# Patient Record
Sex: Female | Born: 1955
Health system: Southern US, Community
[De-identification: ages and names within clinical notes are randomized; demographics above are authoritative.]

## PROBLEM LIST (undated history)

## (undated) DIAGNOSIS — I10 Essential (primary) hypertension: Secondary | ICD-10-CM

## (undated) DIAGNOSIS — I340 Nonrheumatic mitral (valve) insufficiency: Secondary | ICD-10-CM

## (undated) DIAGNOSIS — E559 Vitamin D deficiency, unspecified: Secondary | ICD-10-CM

## (undated) DIAGNOSIS — I509 Heart failure, unspecified: Secondary | ICD-10-CM

## (undated) DIAGNOSIS — E039 Hypothyroidism, unspecified: Secondary | ICD-10-CM

## (undated) DIAGNOSIS — I5020 Unspecified systolic (congestive) heart failure: Secondary | ICD-10-CM

## (undated) DIAGNOSIS — K219 Gastro-esophageal reflux disease without esophagitis: Secondary | ICD-10-CM

## (undated) DIAGNOSIS — T7840XA Allergy, unspecified, initial encounter: Secondary | ICD-10-CM

## (undated) DIAGNOSIS — Z720 Tobacco use: Secondary | ICD-10-CM

## (undated) DIAGNOSIS — F419 Anxiety disorder, unspecified: Secondary | ICD-10-CM

## (undated) HISTORY — DX: Gastro-esophageal reflux disease without esophagitis: K21.9

## (undated) HISTORY — DX: Anxiety disorder, unspecified: F41.9

## (undated) HISTORY — PX: DIAGNOSTIC LAPAROSCOPY: SUR761

## (undated) HISTORY — PX: ABDOMINAL HYSTERECTOMY: SHX81

## (undated) HISTORY — DX: Allergy, unspecified, initial encounter: T78.40XA

## (undated) HISTORY — PX: TUBAL LIGATION: SHX77

## (undated) HISTORY — DX: Vitamin D deficiency, unspecified: E55.9

---
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
## 2000-03-04 ENCOUNTER — Emergency Department (HOSPITAL_COMMUNITY): Admission: EM | Admit: 2000-03-04 | Discharge: 2000-03-04 | Payer: Self-pay

## 2001-09-20 ENCOUNTER — Emergency Department (HOSPITAL_COMMUNITY): Admission: EM | Admit: 2001-09-20 | Discharge: 2001-09-20 | Payer: Self-pay | Admitting: Emergency Medicine

## 2002-01-10 ENCOUNTER — Emergency Department (HOSPITAL_COMMUNITY): Admission: EM | Admit: 2002-01-10 | Discharge: 2002-01-10 | Payer: Self-pay

## 2002-09-10 ENCOUNTER — Emergency Department (HOSPITAL_COMMUNITY): Admission: EM | Admit: 2002-09-10 | Discharge: 2002-09-10 | Payer: Self-pay | Admitting: Emergency Medicine

## 2002-10-13 ENCOUNTER — Encounter: Admission: RE | Admit: 2002-10-13 | Discharge: 2002-10-13 | Payer: Self-pay | Admitting: Family Medicine

## 2003-05-12 ENCOUNTER — Encounter: Admission: RE | Admit: 2003-05-12 | Discharge: 2003-05-12 | Payer: Self-pay | Admitting: Family Medicine

## 2005-02-15 ENCOUNTER — Emergency Department (HOSPITAL_COMMUNITY): Admission: EM | Admit: 2005-02-15 | Discharge: 2005-02-15 | Payer: Self-pay | Admitting: *Deleted

## 2006-08-27 DIAGNOSIS — F17200 Nicotine dependence, unspecified, uncomplicated: Secondary | ICD-10-CM | POA: Insufficient documentation

## 2006-08-27 DIAGNOSIS — D259 Leiomyoma of uterus, unspecified: Secondary | ICD-10-CM | POA: Insufficient documentation

## 2006-11-19 ENCOUNTER — Ambulatory Visit: Payer: Self-pay | Admitting: Gynecology

## 2006-11-19 ENCOUNTER — Encounter (INDEPENDENT_AMBULATORY_CARE_PROVIDER_SITE_OTHER): Payer: Self-pay | Admitting: Gynecology

## 2007-01-11 ENCOUNTER — Ambulatory Visit: Payer: Self-pay | Admitting: Gynecology

## 2007-01-11 ENCOUNTER — Encounter (INDEPENDENT_AMBULATORY_CARE_PROVIDER_SITE_OTHER): Payer: Self-pay | Admitting: Gynecology

## 2007-01-11 ENCOUNTER — Inpatient Hospital Stay (HOSPITAL_COMMUNITY): Admission: RE | Admit: 2007-01-11 | Discharge: 2007-01-14 | Payer: Self-pay | Admitting: Gynecology

## 2007-01-18 ENCOUNTER — Ambulatory Visit: Payer: Self-pay | Admitting: Obstetrics & Gynecology

## 2007-03-03 ENCOUNTER — Ambulatory Visit: Payer: Self-pay | Admitting: Obstetrics & Gynecology

## 2008-10-02 ENCOUNTER — Emergency Department (HOSPITAL_COMMUNITY): Admission: EM | Admit: 2008-10-02 | Discharge: 2008-10-02 | Payer: Self-pay | Admitting: Emergency Medicine

## 2009-05-09 ENCOUNTER — Encounter: Admission: RE | Admit: 2009-05-09 | Discharge: 2009-05-09 | Payer: Self-pay | Admitting: Occupational Medicine

## 2009-06-05 ENCOUNTER — Emergency Department (HOSPITAL_COMMUNITY): Admission: EM | Admit: 2009-06-05 | Discharge: 2009-06-05 | Payer: Self-pay | Admitting: Emergency Medicine

## 2009-06-15 ENCOUNTER — Encounter: Admission: RE | Admit: 2009-06-15 | Discharge: 2009-07-03 | Payer: Self-pay | Admitting: Occupational Medicine

## 2009-07-03 ENCOUNTER — Encounter: Admission: RE | Admit: 2009-07-03 | Discharge: 2009-10-01 | Payer: Self-pay | Admitting: Occupational Medicine

## 2009-08-12 ENCOUNTER — Emergency Department (HOSPITAL_COMMUNITY): Admission: EM | Admit: 2009-08-12 | Discharge: 2009-08-12 | Payer: Self-pay | Admitting: Emergency Medicine

## 2009-10-15 ENCOUNTER — Emergency Department (HOSPITAL_COMMUNITY): Admission: EM | Admit: 2009-10-15 | Discharge: 2009-10-15 | Payer: Self-pay | Admitting: Emergency Medicine

## 2010-10-09 LAB — BASIC METABOLIC PANEL
BUN: 8 mg/dL (ref 6–23)
CO2: 28 mEq/L (ref 19–32)
Calcium: 9.1 mg/dL (ref 8.4–10.5)
Chloride: 107 mEq/L (ref 96–112)
Creatinine, Ser: 0.47 mg/dL (ref 0.4–1.2)
GFR calc Af Amer: >60 mL/min (ref >60)
GFR calc non Af Amer: >60 mL/min (ref >60)
Glucose, Bld: 94 mg/dL (ref 70–99)
Potassium: 3.7 mEq/L (ref 3.5–5.1)
Sodium: 140 mEq/L (ref 135–145)

## 2010-10-09 LAB — URINALYSIS, ROUTINE W REFLEX MICROSCOPIC
Glucose, UA: NEGATIVE mg/dL
Ketones, ur: NEGATIVE mg/dL
Nitrite: NEGATIVE
Specific Gravity, Urine: 1.019 (ref 1.005–1.030)
pH: 6.5 (ref 5.0–8.0)

## 2010-11-12 NOTE — Group Therapy Note (Signed)
NAME:  Julia [name], Julia [name] NO.:  [id number]   MEDICAL RECORD NO.:  [id number]          PATIENT TYPE:  WOC   LOCATION:  [facility]                   FACILITY:  [facility]   PHYSICIAN:  Elsie Lincoln, MD      DATE OF BIRTH:  Dec 15, 1955   DATE OF SERVICE:  03/03/2007                                  CLINIC NOTE   HISTORY:  The patient is a 55 year old female 6 weeks after a TAH RSO.  She had her staples out several weeks ago, however, there is one staple  left and we took that out today after injecting 1 mL of 1% lidocaine and  the staple was removed easily, there was no disruption of the wound.  The patient is complaining of vaginal discharge.  There is a copious  yellowish discharge in the vagina which revealed clue cells on my exam.  There was no Trichomonas.  She does have a history of Trichomonas in the  past.  She has not been sexually active.  The patient was instructed not  to douche to help her vaginal pH to get back in balance.  The vaginal  cuff is intact and the bimanual exam is negative.  The patient is still  in need of a mammogram but due to her lack of insurance she is waiting  for the mammogram scholarship to come in October.  She is also in need  of a colonoscopy, I believe we did not address this today and will  address it at her next visit.  She can afford a colonoscopy and we will  send her home with a guaiac card.  The patient is to return in 1 year.           ______________________________  Elsie Lincoln, MD     KL/MEDQ  D:  03/03/2007  T:  03/03/2007  Job:  [id number]

## 2010-11-12 NOTE — Op Note (Signed)
NAME:  Julia [name], Julia [name]               ACCOUNT NO.:  [id number]   MEDICAL RECORD NO.:  [id number]          PATIENT TYPE:  INP   LOCATION:  9310                          [facility]   PHYSICIAN:  Ginger Carne, MD  DATE OF BIRTH:  [birth year]   DATE OF PROCEDURE:  01/11/2007  DATE OF DISCHARGE:                               OPERATIVE REPORT   PREOPERATIVE DIAGNOSIS:  Post hysterectomy bleeding.   POSTOPERATIVE DIAGNOSIS:  Post hysterectomy bleeding, vaginal cuff  bleeding.   OPERATIVE PROCEDURE:  Hemostatic control of vaginal cuff bleeding.   SURGEON:  Ginger Carne, M.D.   ASSISTANT:  None.   COMPLICATIONS:  None immediate.   ESTIMATED BLOOD LOSS:  200 mL.   ANESTHESIA:  General.   OPERATIVE FINDINGS:  Upon opening the abdomen 200 mL of freshly clotted  blood was noted in the cul-de-sac and around the vaginal cuff after  clearing said blood was noted.  There were two bleeding points on the  anterior cuff on the right half of the anterior cuff.  In addition the  angle demonstrated bleeding as well.  The left utero-ovarian ligament  and right infundibulopelvic ligaments were dry.  The uterine vasculature  was dry and both ureters were carefully identified throughout their  pelvic course of particularly near the cuff to assure no injury after  placement of hemostatic sutures.   OPERATIVE PROCEDURE:  The patient prepped and draped in usual fashion  and placed in lithotomy position.  Betadine solution used for antiseptic  and the patient already had a Foley catheter inserted from her previous  surgery.  Skin staples were removed.  Fascia suture was removed and the  abdomen opened.  After removing said blood clots.  The old suture line  was taken down off the cuff including the angles of the anterior  posterior cuff was then reapproximated with interrupted 0 Vicryl sutures  in a hemostatic fashion.  This was a one layer closure. At the end no  active bleeding noted.  The  patient was then irrigated lactated Ringer's  irrigant removed.  Inspection for 5 minutes revealed no active bleeding.  Clots removed from the abdomen.  The closure of the cuff of the fascia  in one  layer with zero PDS double loop running suture and skin staples for the  skin.  Instrument and sponge count were correct.  The patient tolerated  the procedure well, returned to the post anesthesia recovery room in  excellent condition.      Ginger Carne, MD  Electronically Signed     SHB/MEDQ  D:  01/11/2007  T:  01/11/2007  Job:  [id number]

## 2010-11-12 NOTE — Discharge Summary (Signed)
NAME:  Julia [name], Julia [name]               ACCOUNT NO.:  [id number]   MEDICAL RECORD NO.:  [id number]          PATIENT TYPE:  INP   LOCATION:  9310                          [facility]   PHYSICIAN:  Ginger Carne, MD  DATE OF BIRTH:  [birth year]   DATE OF ADMISSION:  01/11/2007  DATE OF DISCHARGE:                               DISCHARGE SUMMARY   REASON FOR HOSPITALIZATION:  24 to 26-week leiomyomatous uterus.   IN-HOSPITAL PROCEDURES:  Total abdominal hysterectomy, right salpingo-  oophorectomy with preservation of left tube and ovary, followed by  exploratory laparotomy and hemostatic control of cuff bleeding.   FINAL DIAGNOSIS:  24-26-week leiomyomatous uterus with postoperative  bleeding.   HOSPITAL COURSE:  This patient is a 55 year old [place] American female  who underwent the aforementioned procedures on January 11, 2007.  Within 2  hours of her hysterectomy it was noted that the patient had passed blood  clots in the vagina with fullness in cuff.  The patient was immediately  taken back to the operating theater at which time it was noted that  three anterior vaginal wall bleeders were encountered.  The patient was  perfectly dry prior to her closure at the time of the initial  hysterectomy.  Resuturing of the cuff followed exploratory laparotomy  and the patient was dry at the end of the procedure. Her preoperative  hemoglobin was 9.9, hematocrit 32.6. Immediately postoperatively her  hemoglobin had dropped to 8.6 and by July 15, following her exploratory  laparotomy had stabilized 8.1 with hematocrit of 26.5.  The patient was  asymptomatic for said blood loss.  Her abdomen was soft.  Incision dry.  Scant vaginal flow.  Calves without tenderness.  Lungs were clear.  The  patient was passing flatus at the time of her discharge.  She was  discharged with routine postoperative instructions including contacting  the office for temperature elevation above 100.4 degrees Fahrenheit,  increasing incisional pain, drainage, erythema, vaginal bleeding, GI or  GU complaints.  The patient was prescribed Percocet 5/325 one to two  every 4-6 hours and given a Dulcolax suppository and a tablet in the  morning of her discharge.  She will return on July 21 for staple removal  and then for her routine 4-6-week postoperative visit.  All questions  answered to the satisfaction of said patient and the patient verbalized  understanding of same.  The patient had no preoperative medications on a  routine basis that she was using.      Ginger Carne, MD  Electronically Signed     SHB/MEDQ  D:  01/14/2007  T:  01/14/2007  Job:  [id number]

## 2010-11-12 NOTE — Op Note (Signed)
NAME:  Julia [name], Julia [name]               ACCOUNT NO.:  [id number]   MEDICAL RECORD NO.:  [id number]          PATIENT TYPE:  INP   LOCATION:  9310                          [facility]   PHYSICIAN:  Ginger Carne, MD  DATE OF BIRTH:  06-17-1956   DATE OF PROCEDURE:  01/11/2007  DATE OF DISCHARGE:                               OPERATIVE REPORT   PREOPERATIVE DIAGNOSIS:  Greater than 20-week leiomyomatous uterus.   POSTOPERATIVE DIAGNOSIS:  26 to 28-week leiomyomatous uterus.   OPERATIVE PROCEDURE:  Total abdominal hysterectomy, right salpingo-  oophorectomy with preservation of left tube and ovary.   SURGEON:  Ginger Carne, M.D.   ASSISTANT:  Dr. Okey Dupre.   ESTIMATED BLOOD LOSS:  200 mL.   COMPLICATIONS:  None immediate.   SPECIMEN:  Uterus, cervix and right tube and ovary.   OPERATIVE FINDINGS:  Uterus was 26-28 weeks in size, soft and globular,  consistent with adenomyosis and leiomyomatous uterus.  The right ovary  contained a 3-4 cm simple cyst.  However, it was deemed appropriate to  remove said mass. The left ovary and tube appeared normal.  Therefore  the right tube and ovary were removed.  The patient had desired  preservation of one or both ovaries if possible as long as they appeared  normal.  Large and small bowel grossly normal.  Both ureters identified  throughout the pelvic course during the procedure.   OPERATIVE PROCEDURE:  The patient prepped and draped in usual fashion  and placed in the supine position.  Betadine solution used for  antiseptic.  The patient catheterized prior to procedure.  After  adequate general anesthesia a vertical supraumbilical and lower  abdominal incision was made and the abdomen opened.  Self-retaining  retractors utilized.  The round ligaments were clamped, cut and ligated  on either side with 0 Vicryl suture.  Anterior posterior peritoneal  reflection was dissected off the uterus in a standard Richardson  fashion.  The uterine  vasculature was clamped, cut and ligated with 0  Vicryl suture.  The left utero-ovarian ligament and right infundibular  pelvic ligaments were taken with double ligation 0 Vicryl suture.  Cervix, uterus removed from the vaginal cuff.  Copious irrigation  followed, irrigant removed and then closure of the cuff with 0 Vicryl  running interlocking suture, angles  tied separately.  Bleeding points hemostatically checked.  Blood clots  removed.  Closure of the fascial layers of with 0 PDS double loop suture  and skin staples for the skin. Instrument and sponge count were correct.  The patient tolerated the procedure well, returned to post anesthesia  recovery room in excellent condition.      Ginger Carne, MD  Electronically Signed     SHB/MEDQ  D:  01/11/2007  T:  01/11/2007  Job:  [id number]

## 2011-02-11 ENCOUNTER — Emergency Department (HOSPITAL_COMMUNITY)
Admission: EM | Admit: 2011-02-11 | Discharge: 2011-02-11 | Disposition: A | Discharge status: Home / Self Care | Payer: Managed Care, Other (non HMO) | Attending: Emergency Medicine | Admitting: Emergency Medicine

## 2011-02-11 DIAGNOSIS — I10 Essential (primary) hypertension: Secondary | ICD-10-CM | POA: Insufficient documentation

## 2011-02-11 DIAGNOSIS — M25519 Pain in unspecified shoulder: Secondary | ICD-10-CM | POA: Insufficient documentation

## 2011-02-11 DIAGNOSIS — S335XXA Sprain of ligaments of lumbar spine, initial encounter: Secondary | ICD-10-CM | POA: Insufficient documentation

## 2011-02-11 DIAGNOSIS — M545 Low back pain, unspecified: Secondary | ICD-10-CM | POA: Insufficient documentation

## 2011-04-14 LAB — BASIC METABOLIC PANEL WITH GFR
BUN: 2 — ABNORMAL LOW
CO2: 27
Calcium: 8.6
Chloride: 98
Creatinine, Ser: 0.57
GFR calc non Af Amer: >60
Glucose, Bld: 131 — ABNORMAL HIGH
Potassium: 4
Sodium: 132 — ABNORMAL LOW

## 2011-04-14 LAB — CBC
HCT: 26.5 — ABNORMAL LOW
Hemoglobin: 8.1 — ABNORMAL LOW
MCHC: 30.7
RBC: 3.64 — ABNORMAL LOW
RDW: 21.5 — ABNORMAL HIGH

## 2011-04-15 LAB — CBC
HCT: 32.6 — ABNORMAL LOW
MCHC: 30.4
MCHC: 30.7
MCV: 73.2 — ABNORMAL LOW
Platelets: 206
Platelets: 209
RBC: 3.87
RDW: 21.2 — ABNORMAL HIGH
RDW: 21.5 — ABNORMAL HIGH

## 2011-04-15 LAB — BASIC METABOLIC PANEL
BUN: 6
CO2: 25
GFR calc non Af Amer: >60
Glucose, Bld: 101 — ABNORMAL HIGH
Potassium: 3.4 — ABNORMAL LOW

## 2014-01-28 HISTORY — PX: CARDIAC CATHETERIZATION: SHX172

## 2014-02-08 ENCOUNTER — Emergency Department (HOSPITAL_COMMUNITY): Payer: Medicaid Other

## 2014-02-08 ENCOUNTER — Inpatient Hospital Stay (HOSPITAL_COMMUNITY)
Admission: EM | Admit: 2014-02-08 | Discharge: 2014-02-12 | DRG: 287 | Disposition: A | Discharge status: Home / Self Care | Payer: Medicaid Other | Attending: Internal Medicine | Admitting: Internal Medicine

## 2014-02-08 ENCOUNTER — Encounter (HOSPITAL_COMMUNITY): Payer: Self-pay | Admitting: Emergency Medicine

## 2014-02-08 DIAGNOSIS — I447 Left bundle-branch block, unspecified: Secondary | ICD-10-CM | POA: Diagnosis present

## 2014-02-08 DIAGNOSIS — I420 Dilated cardiomyopathy: Secondary | ICD-10-CM

## 2014-02-08 DIAGNOSIS — R7309 Other abnormal glucose: Secondary | ICD-10-CM | POA: Diagnosis present

## 2014-02-08 DIAGNOSIS — Z0389 Encounter for observation for other suspected diseases and conditions ruled out: Secondary | ICD-10-CM

## 2014-02-08 DIAGNOSIS — IMO0001 Reserved for inherently not codable concepts without codable children: Secondary | ICD-10-CM

## 2014-02-08 DIAGNOSIS — I5021 Acute systolic (congestive) heart failure: Principal | ICD-10-CM | POA: Diagnosis present

## 2014-02-08 DIAGNOSIS — F17200 Nicotine dependence, unspecified, uncomplicated: Secondary | ICD-10-CM | POA: Diagnosis present

## 2014-02-08 DIAGNOSIS — I059 Rheumatic mitral valve disease, unspecified: Secondary | ICD-10-CM | POA: Diagnosis present

## 2014-02-08 DIAGNOSIS — J810 Acute pulmonary edema: Secondary | ICD-10-CM

## 2014-02-08 DIAGNOSIS — I10 Essential (primary) hypertension: Secondary | ICD-10-CM | POA: Diagnosis present

## 2014-02-08 DIAGNOSIS — I509 Heart failure, unspecified: Secondary | ICD-10-CM | POA: Diagnosis present

## 2014-02-08 DIAGNOSIS — I340 Nonrheumatic mitral (valve) insufficiency: Secondary | ICD-10-CM | POA: Diagnosis present

## 2014-02-08 DIAGNOSIS — R0602 Shortness of breath: Secondary | ICD-10-CM

## 2014-02-08 DIAGNOSIS — Z720 Tobacco use: Secondary | ICD-10-CM | POA: Diagnosis present

## 2014-02-08 DIAGNOSIS — E875 Hyperkalemia: Secondary | ICD-10-CM | POA: Diagnosis not present

## 2014-02-08 DIAGNOSIS — I428 Other cardiomyopathies: Secondary | ICD-10-CM | POA: Diagnosis present

## 2014-02-08 DIAGNOSIS — Z8679 Personal history of other diseases of the circulatory system: Secondary | ICD-10-CM | POA: Diagnosis present

## 2014-02-08 DIAGNOSIS — I501 Left ventricular failure: Secondary | ICD-10-CM

## 2014-02-08 HISTORY — DX: Essential (primary) hypertension: I10

## 2014-02-08 HISTORY — DX: Nonrheumatic mitral (valve) insufficiency: I34.0

## 2014-02-08 HISTORY — DX: Unspecified systolic (congestive) heart failure: I50.20

## 2014-02-08 HISTORY — DX: Tobacco use: Z72.0

## 2014-02-08 HISTORY — DX: Heart failure, unspecified: I50.9

## 2014-02-08 LAB — CBC
HEMATOCRIT: 44.6 % (ref 36.0–46.0)
HEMOGLOBIN: 14.9 g/dL (ref 12.0–15.0)
MCH: 31.1 pg (ref 26.0–34.0)
MCHC: 33.4 g/dL (ref 30.0–36.0)
MCV: 93.1 fL (ref 78.0–100.0)
Platelets: 177 10*3/uL (ref 150–400)
RBC: 4.79 MIL/uL (ref 3.87–5.11)
RDW: 13.4 % (ref 11.5–15.5)
WBC: 7.4 10*3/uL (ref 4.0–10.5)

## 2014-02-08 LAB — PRO B NATRIURETIC PEPTIDE: Pro B Natriuretic peptide (BNP): 2463 pg/mL — ABNORMAL HIGH (ref 0–125)

## 2014-02-08 LAB — BASIC METABOLIC PANEL
ANION GAP: 15 (ref 5–15)
BUN: 11 mg/dL (ref 6–23)
CHLORIDE: 101 meq/L (ref 96–112)
CO2: 20 meq/L (ref 19–32)
CREATININE: 0.6 mg/dL (ref 0.50–1.10)
Calcium: 9.3 mg/dL (ref 8.4–10.5)
GFR calc Af Amer: >90 mL/min (ref >90)
GFR calc non Af Amer: >90 mL/min (ref >90)
GLUCOSE: 102 mg/dL — AB (ref 70–99)
Potassium: 4.2 mEq/L (ref 3.7–5.3)
Sodium: 136 mEq/L — ABNORMAL LOW (ref 137–147)

## 2014-02-08 LAB — URINALYSIS, ROUTINE W REFLEX MICROSCOPIC
Bilirubin Urine: NEGATIVE
GLUCOSE, UA: NEGATIVE mg/dL
Ketones, ur: NEGATIVE mg/dL
LEUKOCYTES UA: NEGATIVE
Nitrite: NEGATIVE
PH: 6.5 (ref 5.0–8.0)
PROTEIN: 30 mg/dL — AB
SPECIFIC GRAVITY, URINE: 1.014 (ref 1.005–1.030)
Urobilinogen, UA: 1 mg/dL (ref 0.0–1.0)

## 2014-02-08 LAB — URINE MICROSCOPIC-ADD ON

## 2014-02-08 LAB — I-STAT TROPONIN, ED: Troponin i, poc: 0 ng/mL (ref 0.00–0.08)

## 2014-02-08 LAB — D-DIMER, QUANTITATIVE: D-Dimer, Quant: 1.4 ug/mL-FEU — ABNORMAL HIGH (ref 0.00–0.48)

## 2014-02-08 MED ORDER — IOHEXOL 350 MG/ML SOLN
100.0000 mL | Freq: Once | INTRAVENOUS | Status: AC | PRN
  Administered 2014-02-08: 100 mL via INTRAVENOUS

## 2014-02-08 MED ORDER — LABETALOL HCL 5 MG/ML IV SOLN
20.0000 mg | Freq: Once | INTRAVENOUS | Status: AC
  Administered 2014-02-09: 20 mg via INTRAVENOUS
  Filled 2014-02-08: qty 4

## 2014-02-08 MED ORDER — FUROSEMIDE 10 MG/ML IJ SOLN
40.0000 mg | Freq: Once | INTRAMUSCULAR | Status: AC
  Administered 2014-02-08: 40 mg via INTRAVENOUS
  Filled 2014-02-08: qty 4

## 2014-02-08 NOTE — ED Notes (Addendum)
EKG given to EDP, Linker,MD., for review. Pt. On cardiac monitor.

## 2014-02-08 NOTE — ED Notes (Signed)
Per pt, states cold symptoms which started on Sunday-might be related to her allergies

## 2014-02-08 NOTE — H&P (Signed)
History and Physical  Julia [name] JAS:[id number] DOB: June 16, 1956 DOA: 02/08/2014   PCP: No PCP Per Patient   Chief Complaint: Shortness of breath  HPI:  58 year old female without any known chronic medical problems presents with three-day history of shortness of breath and dyspnea on exertion. The patient stated that shortness of breath progressively got worse, and she experienced orthopnea and PND 2 days prior to this admission. She also noticed some ankle edema over the past 3 days. The patient states that she was told that she was "borderline hypertensive" 4 years ago. She is not on any medications. She denies taking any over-the-counter medications except for intermittent ibuprofen. She denies any fevers, chills, chest discomfort, dizziness, nausea, vomiting, diarrhea, abdominal pain, dysuria, hematuria, headache, visual disturbance.  In the emergency department, the patient had a chest x-ray reviewed revealed bilateral increased interstitial markings. D-dimer was elevated at 1.40. CT angiogram was obtained and was negative for pulmonary embolus but did show interstitial edema. Her BNP was 2463. Urinalysis was negative for any pyuria. The patient was cared furosemide 40 mg IV as well as labetalol 20 mg IV x1 due to elevated blood pressure of 197/115. BMP and CBC were essentially unremarkable. The ED physician spoke with cardiology fellow, Dr. Murvin Natal, whom wanted patient to be transferred to [facility]. Assessment/Plan: Acute CHF -Start intravenous furosemide 20mg  IV q 12 hr -Daily weights -Fluid restriction -Echocardiogram Malignant HTN -start carvedilol -start low dose lisinopril -hydralazine prn SBP>180 -UDS LBBB -no previous EKG for comparison -echo Tobacco abuse -defers nicotine patch -Tobacco cessation discussed         Past Medical History  Diagnosis Date  . Environmental allergies    History reviewed. No pertinent past surgical history. Social History:   reports that she has been smoking.  She does not have any smokeless tobacco history on file. She reports that she does not drink alcohol or use illicit drugs.   Family history: family history reviewed. No pertinent family hx   No Known Allergies    Prior to Admission medications   Not on File    Review of Systems:  Constitutional:  No weight loss, night sweats, Fevers, chills, fatigue.  Head&Eyes: No headache.  No vision loss.  No eye pain or scotoma ENT:  No Difficulty swallowing,Tooth/dental problems,Sore throat,  No ear ache, post nasal drip,  Cardio-vascular:  No chest pain, Orthopnea, PND, swelling in lower extremities,  dizziness, palpitations  GI:  No  abdominal pain, nausea, vomiting, diarrhea, loss of appetite, hematochezia, melena, heartburn, indigestion, Resp:  No cough. No coughing up of blood .No wheezing.No chest wall deformity  Skin:  no rash or lesions.  GU:  no dysuria, change in color of urine, no urgency or frequency. No flank pain.  Musculoskeletal: No joint pain or swelling. No decreased range of motion. No back pain.  Psych:  No change in mood or affect. No depression or anxiety. Neurologic: No headache, no dysesthesia, no focal weakness, no vision loss. No syncope  Physical Exam: Filed Vitals:   02/08/14 2030 02/08/14 2055 02/08/14 2100 02/08/14 2117  BP: 185/128 194/124 188/125   Pulse: 98  98 96  Temp:    98.5 F (36.9 C)  TempSrc:    Oral  Resp: 16 41 21 19  SpO2: 95%  92% 96%   General:  A&O x 3, NAD, nontoxic, pleasant/cooperative Head/Eye: No conjunctival hemorrhage, no icterus, [place]/AT, No nystagmus ENT:  No icterus,  No thrush, good dentition, no  pharyngeal exudate Neck:  No masses, no lymphadenpathy, no bruits CV:  RRR, no rub, no gallop, no S3 Lung:  Bibasilar crackles. No wheezing. Good air movement. Abdomen: soft/NT, +BS, nondistended, no peritoneal signs Ext: No cyanosis, No rashes, No petechiae, No lymphangitis, trace LE  edema Neuro: CNII-XII intact, strength 4/5 in bilateral upper and lower extremities, no dysmetria  Labs on Admission:  Basic Metabolic Panel:  Recent Labs Lab 02/08/14 1950  NA 136*  K 4.2  CL 101  CO2 20  GLUCOSE 102*  BUN 11  CREATININE 0.60  CALCIUM 9.3   Liver Function Tests: No results found for this basename: AST, ALT, ALKPHOS, BILITOT, PROT, ALBUMIN,  in the last 168 hours No results found for this basename: LIPASE, AMYLASE,  in the last 168 hours No results found for this basename: AMMONIA,  in the last 168 hours CBC:  Recent Labs Lab 02/08/14 1950  WBC 7.4  HGB 14.9  HCT 44.6  MCV 93.1  PLT 177   Cardiac Enzymes: No results found for this basename: CKTOTAL, CKMB, CKMBINDEX, TROPONINI,  in the last 168 hours BNP: No components found with this basename: POCBNP,  CBG: No results found for this basename: GLUCAP,  in the last 168 hours  Radiological Exams on Admission: Dg Chest 2 View  02/08/2014   CLINICAL DATA:  Shortness of Breath  EXAM: CHEST  2 VIEW  COMPARISON:  01/08/2007  FINDINGS: Cardiomediastinal silhouette is unremarkable. No acute infiltrate or pleural effusion. No pulmonary edema. Mild infrahilar bronchitic changes.  IMPRESSION: No segmental infiltrate or pulmonary edema. Mild infrahilar bronchitic changes.   Electronically Signed   By: Lahoma Crocker M.D.   On: 02/08/2014 19:45   Ct Angio Chest Pe W/cm &/or Wo Cm  02/08/2014   CLINICAL DATA:  Sudden onset of shortness of breath. Pulmonary edema.  EXAM: CT ANGIOGRAPHY CHEST WITH CONTRAST  TECHNIQUE: Multidetector CT imaging of the chest was performed using the standard protocol during bolus administration of intravenous contrast. Multiplanar CT image reconstructions and MIPs were obtained to evaluate the vascular anatomy.  CONTRAST:  176mL OMNIPAQUE IOHEXOL 350 MG/ML SOLN  COMPARISON:  Chest x-ray dated 02/08/2014  FINDINGS: There are no pulmonary emboli. The patient has diffuse interstitial pulmonary edema,  most prominent at the lung bases. There is slight cardiomegaly with dilatation of the left ventricle. No effusions. The visualized portion of the upper abdomen is normal. No osseous abnormality  Review of the MIP images confirms the above findings.  IMPRESSION: Interstitial pulmonary edema. Borderline cardiomegaly. Left ventricle appears slightly dilated. No pulmonary emboli.   Electronically Signed   By: Rozetta Nunnery M.D.   On: 02/08/2014 21:41    EKG: Independently reviewed. Sinus, LBBB    Time spent:60 minutes Code Status:   FULL Family Communication:  No  Family at bedside   Taesean Reth, DO  [facility] Pager [phone]  If 7PM-7AM, please contact night-coverage [email or web address] Password [facility] 02/08/2014, 11:38 PM

## 2014-02-08 NOTE — ED Notes (Signed)
Initial Contact - pt resting on stretcher, A+Ox4, reports SOB, cough/sore throat since yesterday.  Pt denies CP/palpitations.  NSR/ST on monitor.  Speaking full/clear sentences, rr even/un-lab, lsctab.  MAEI, self repositioning for comfort.  Skin PWD.  Placed to cardiac/02 monitor, NAD.

## 2014-02-08 NOTE — ED Provider Notes (Signed)
CSN: [id number]     Arrival date & time 02/08/14  1830 History   First MD Initiated Contact with Patient 02/08/14 1944     Chief Complaint  Patient presents with  . Shortness of Breath     (Consider location/radiation/quality/duration/timing/severity/associated sxs/prior Treatment) HPI Pt presents with c/o feeling nasal congestion and cough which began Sunday evening.  She has noted feeling short of breath with small amounts of exertion.  She also states that she feels short or breath with lying flat.  No chest pain.  No fever/chills.  Denies swelling of her extremities.  Has not had similar symptoms previously.  She feels her symptoms may be due to her seasonal allergies.  Symptoms are moderate in severity. There are no other associated systemic symptoms, there are no other alleviating or modifying factors.   Past Medical History  Diagnosis Date  . Hypertension   . Systolic CHF     a. 2D ECHO: 02/09/2014; EF 35-30%, mild LV dilation, severe, diffuse hypokinesis, regional WMAs cannot be excluded. Ventricular septum with mild dyssynergy c/w LBBB, mild AR, severe MR, severely dilated LA.  . Mitral regurgitation     a. severe by ECHO  01/2014  . Tobacco abuse    Past Surgical History  Procedure Laterality Date  . 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 Abdominal hysterectomy      partial  . Tubal ligation     History reviewed. No pertinent family history. History  Substance Use Topics  . Smoking status: Current Every Day Smoker -- 0.50 packs/day    Types: Cigarettes  . Smokeless tobacco: Not on file  . Alcohol Use: No   OB History   Grav Para Term Preterm Abortions TAB SAB Ect Mult Living                 Review of Systems ROS reviewed and all otherwise negative except for mentioned in HPI     Allergies  Review of patient's allergies indicates no known allergies.  Home Medications   Prior to Admission medications   Not on File   BP 117/73  Pulse 77   Temp(Src) 98.4 F (36.9 C) (Oral)  Resp 18  Ht 5\' 7"  (1.702 m)  Wt 188 lb 11.8 oz (85.61 kg)  BMI 29.55 kg/m2  SpO2 98% Vitals reviewed Physical Exam Physical Examination: General appearance - alert, well appearing, and in no distress Mental status - alert, oriented to person, place, and time Eyes - no scleral icterus, no conjunctival injection Mouth - mucous membranes moist, pharynx normal without lesions Chest - clear to auscultation, no wheezes, rales or rhonchi, symmetric air entry, short of breath with walking to the bathroom Heart - normal rate, regular rhythm, normal S1, S2, no murmurs, rubs, clicks or gallops Abdomen - soft, nontender, nondistended, no masses or organomegaly Extremities - peripheral pulses normal, no pedal edema, no clubbing or cyanosis Skin - normal coloration and turgor, no rashes  ED Course  Procedures (including critical care time)  11:00 PM discussed presentation and findings with cardiology Dr. Murvin Natal who recommends hospitalist admission with transfer to [facility]- echo in the AM.    11:14 PM discussed findings and plan with patient, she is agreeable with plan for admission.  D/w [facility] who will see patient in the ED.   Labs Review Labs Reviewed  BASIC METABOLIC PANEL - Abnormal; Notable for the following:    Sodium 136 (*)    Glucose, Bld 102 (*)  All other components within normal limits  PRO B NATRIURETIC PEPTIDE - Abnormal; Notable for the following:    Pro B Natriuretic peptide (BNP) 2463.0 (*)    All other components within normal limits  D-DIMER, QUANTITATIVE - Abnormal; Notable for the following:    D-Dimer, Quant 1.40 (*)    All other components within normal limits  URINALYSIS, ROUTINE W REFLEX MICROSCOPIC - Abnormal; Notable for the following:    Hgb urine dipstick SMALL (*)    Protein, ur 30 (*)    All other components within normal limits  BASIC METABOLIC PANEL - Abnormal; Notable for the following:    Potassium 3.5  (*)    Glucose, Bld 108 (*)    All other components within normal limits  BASIC METABOLIC PANEL - Abnormal; Notable for the following:    GFR calc non Af Amer 66 (*)    GFR calc Af Amer 77 (*)    All other components within normal limits  PRO B NATRIURETIC PEPTIDE - Abnormal; Notable for the following:    Pro B Natriuretic peptide (BNP) 767.3 (*)    All other components within normal limits  HEMOGLOBIN A1C - Abnormal; Notable for the following:    Hemoglobin A1C 5.8 (*)    Mean Plasma Glucose 120 (*)    All other components within normal limits  LIPID PANEL - Abnormal; Notable for the following:    Cholesterol 204 (*)    LDL Cholesterol 118 (*)    All other components within normal limits  BASIC METABOLIC PANEL - Abnormal; Notable for the following:    Sodium 136 (*)    Potassium 5.6 (*)    All other components within normal limits  CBC - Abnormal; Notable for the following:    RBC 5.41 (*)    Hemoglobin 16.7 (*)    HCT 51.4 (*)    All other components within normal limits  CBC  URINE MICROSCOPIC-ADD ON  URINE RAPID DRUG SCREEN (HOSP PERFORMED)  PROTIME-INR  CREATININE, SERUM  I-STAT TROPOININ, ED    Imaging Review No results found.   EKG Interpretation   Date/Time:  Wednesday February 08 2014 19:30:49 EDT Ventricular Rate:  91 PR Interval:  157 QRS Duration: 152 QT Interval:  418 QTC Calculation: 514 R Axis:   -50 Text Interpretation:  Sinus rhythm Biatrial enlargement Left bundle branch  block Baseline wander in lead(s) V6 Since previous tracing from 2010 LBBB  is new Confirmed by Canary Brim  MD, [place] [phone]) on 02/08/2014 11:29:52 PM      MDM   Final diagnoses:  Pulmonary edema cardiac cause  Essential hypertension  Shortness of breath    Pt presenting with shortness of breath with exertion, orthopnea- found to be hypertensive- pt is not on medications for this- has not seen doctor in some time.  CT scan obtained to r/o PE after elevated d-dimer did not  show PE but did show pulmonary edema.  Pt treated with lasix and labetaolol in the Ed.  D/w cardiology who recommends hospitalist admission at [facility].  Echo tomorrow.      Threasa Beards, MD 02/11/14 [phone]

## 2014-02-08 NOTE — ED Notes (Signed)
PATIENT GONE TO X-RAY I WILL DRAW LABS WHEN SHE RETURNS.

## 2014-02-09 ENCOUNTER — Encounter (HOSPITAL_COMMUNITY): Payer: Self-pay | Admitting: Surgery

## 2014-02-09 DIAGNOSIS — J810 Acute pulmonary edema: Secondary | ICD-10-CM

## 2014-02-09 LAB — BASIC METABOLIC PANEL
Anion gap: 14 (ref 5–15)
BUN: 9 mg/dL (ref 6–23)
CALCIUM: 9.4 mg/dL (ref 8.4–10.5)
CO2: 25 meq/L (ref 19–32)
CREATININE: 0.65 mg/dL (ref 0.50–1.10)
Chloride: 102 mEq/L (ref 96–112)
GFR calc Af Amer: >90 mL/min (ref >90)
GFR calc non Af Amer: >90 mL/min (ref >90)
GLUCOSE: 108 mg/dL — AB (ref 70–99)
Potassium: 3.5 mEq/L — ABNORMAL LOW (ref 3.7–5.3)
Sodium: 141 mEq/L (ref 137–147)

## 2014-02-09 LAB — RAPID URINE DRUG SCREEN, HOSP PERFORMED
AMPHETAMINES: NOT DETECTED
BARBITURATES: NOT DETECTED
Benzodiazepines: NOT DETECTED
Cocaine: NOT DETECTED
Opiates: NOT DETECTED
TETRAHYDROCANNABINOL: NOT DETECTED

## 2014-02-09 MED ORDER — ENOXAPARIN SODIUM 40 MG/0.4ML ~~LOC~~ SOLN
40.0000 mg | Freq: Every day | SUBCUTANEOUS | Status: DC
  Administered 2014-02-09 – 2014-02-10 (×2): 40 mg via SUBCUTANEOUS
  Filled 2014-02-09 (×2): qty 0.4

## 2014-02-09 MED ORDER — HYDRALAZINE HCL 20 MG/ML IJ SOLN: 5.0000 mg | Freq: Four times a day (QID) | INTRAMUSCULAR | Status: DC | PRN

## 2014-02-09 MED ORDER — SODIUM CHLORIDE 0.9 % IV SOLN: 250.0000 mL | INTRAVENOUS | Status: DC | PRN

## 2014-02-09 MED ORDER — SODIUM CHLORIDE 0.9 % IJ SOLN
3.0000 mL | Freq: Two times a day (BID) | INTRAMUSCULAR | Status: DC
  Administered 2014-02-09 – 2014-02-10 (×4): 3 mL via INTRAVENOUS

## 2014-02-09 MED ORDER — LISINOPRIL 2.5 MG PO TABS
2.5000 mg | ORAL_TABLET | Freq: Every day | ORAL | Status: DC
  Administered 2014-02-09: 2.5 mg via ORAL
  Filled 2014-02-09: qty 1

## 2014-02-09 MED ORDER — SODIUM CHLORIDE 0.9 % IJ SOLN: 3.0000 mL | INTRAMUSCULAR | Status: DC | PRN

## 2014-02-09 MED ORDER — FUROSEMIDE 10 MG/ML IJ SOLN
20.0000 mg | Freq: Two times a day (BID) | INTRAMUSCULAR | Status: DC
  Administered 2014-02-09: 20 mg via INTRAVENOUS
  Filled 2014-02-09 (×2): qty 2

## 2014-02-09 MED ORDER — FUROSEMIDE 10 MG/ML IJ SOLN
40.0000 mg | Freq: Two times a day (BID) | INTRAMUSCULAR | Status: DC
  Administered 2014-02-09 – 2014-02-11 (×4): 40 mg via INTRAVENOUS
  Filled 2014-02-09 (×7): qty 4

## 2014-02-09 MED ORDER — ONDANSETRON HCL 4 MG/2ML IJ SOLN: 4.0000 mg | Freq: Four times a day (QID) | INTRAMUSCULAR | Status: DC | PRN

## 2014-02-09 MED ORDER — POTASSIUM CHLORIDE CRYS ER 20 MEQ PO TBCR
40.0000 meq | EXTENDED_RELEASE_TABLET | Freq: Two times a day (BID) | ORAL | Status: DC
  Administered 2014-02-09 – 2014-02-10 (×3): 40 meq via ORAL
  Filled 2014-02-09 (×6): qty 2

## 2014-02-09 MED ORDER — ASPIRIN EC 81 MG PO TBEC
81.0000 mg | DELAYED_RELEASE_TABLET | Freq: Every day | ORAL | Status: DC
  Administered 2014-02-09 – 2014-02-12 (×4): 81 mg via ORAL
  Filled 2014-02-09 (×4): qty 1

## 2014-02-09 MED ORDER — CARVEDILOL 6.25 MG PO TABS
6.2500 mg | ORAL_TABLET | Freq: Two times a day (BID) | ORAL | Status: DC
  Administered 2014-02-09 – 2014-02-12 (×7): 6.25 mg via ORAL
  Filled 2014-02-09 (×10): qty 1

## 2014-02-09 MED ORDER — LISINOPRIL 5 MG PO TABS
5.0000 mg | ORAL_TABLET | Freq: Every day | ORAL | Status: DC
  Administered 2014-02-10 – 2014-02-11 (×2): 5 mg via ORAL
  Filled 2014-02-09 (×2): qty 1

## 2014-02-09 MED ORDER — POTASSIUM CHLORIDE CRYS ER 20 MEQ PO TBCR
20.0000 meq | EXTENDED_RELEASE_TABLET | Freq: Every day | ORAL | Status: DC
  Administered 2014-02-09: 20 meq via ORAL
  Filled 2014-02-09: qty 1

## 2014-02-09 MED ORDER — ACETAMINOPHEN 325 MG PO TABS: 650.0000 mg | ORAL_TABLET | ORAL | Status: DC | PRN

## 2014-02-09 NOTE — Care Management Note (Addendum)
  Page 1 of 1   02/10/2014     4:09:42 PM CARE MANAGEMENT NOTE 02/10/2014  Patient:  [name],[name]   Account Number:  [id number]  Date Initiated:  02/09/2014  Documentation initiated by:  Stepanie Graver  Subjective/Objective Assessment:   CHF     Action/Plan:   CM to follow for disposition needs   Anticipated DC Date:  02/10/2014   Anticipated DC Plan:  HOME/SELF CARE         Choice offered to / List presented to:             Status of service:  Completed, signed off Medicare Important Message given?   (If response is "NO", the following Medicare IM given date fields will be blank) Date Medicare IM given:   Medicare IM given by:   Date Additional Medicare IM given:   Additional Medicare IM given by:    Discharge Disposition:  HOME/SELF CARE  Per UR Regulation:  Reviewed for med. necessity/level of care/duration of stay  If discussed at Long Length of Stay Meetings, dates discussed:    Comments:  Draeden Kellman RN, BSN, MSHL, CCM  Nurse - Case Manager,  ([facility])  [phone]  02/10/2014 Transfer received for [facility] ED for CHF mgmt Cath scheduled for 02/10/2014 Hx/o no meds prior to admission. Social:  Home with DTR and son that lives near by.  Patient states no plans to return to a job d/t EF of 25%. Insurance:  MCD Potential  ([place] Advisor/Michelle has consulted patient and initiated application process for SSD and MCD) PCP:  None - CM provided education on [facility] for PCP care and [employer] / medication assistance. CM provided patient with sample copy of $4.00 med list for resource needs. CM will continue to montior for medication needs post cath and asssist as indicated. Dispostion Plan:  Home Self Care

## 2014-02-09 NOTE — Consult Note (Signed)
Admit date: 02/08/2014 Referring Physician  Dr. Hal Hope Primary Physician No PCP Per Patient Primary Cardiologist  n/a Reason for Consultation  Pulmonary edema  ID: Julia [name] is a pleasant 58 y.o african [place] female with no known medical diagnosis (inadquate access to healthcare), tobacco use who comes in with chief complaint of dyspnea.   HPI: Pt's symptoms started on Monday, sudden onset at rest. She has had only mild cough with scant sputum production. No fever, no chills. No sick contact. Pt does have PND. She denies LE edema. No episode of angina or syncope or pre-syncope. She denies palpitations. Pt is pretty active and still works full time. She smokes 1pack of cig every 3 days. She denies having smoker's cough in the morning. Pt has not known about high blood pressure.   Pt lives at home with daughter. She works on Designer, television/film set. She denies alcohol or recreational drug use. Pt has not seen a physician in over three years.   PMH:   Past Medical History  Diagnosis Date  . Environmental allergies   . Hypertension   . CHF (congestive heart failure)     New onset  . Shortness of breath     PSH:   Past Surgical History  Procedure Laterality Date  . 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 Abdominal hysterectomy      partial  . Tubal ligation     Allergies:  Review of patient's allergies indicates no known allergies. Prior to Admit Meds:   Prior to Admission medications   Not on File   Fam HX:   History reviewed. No pertinent family history. Social HX:    History   Social History  . Marital Status: Single    Spouse Name: N/A    Number of Children: N/A  . Years of Education: N/A   Occupational History  . Not on file.   Social History Main Topics  . Smoking status: Current Every Day Smoker -- 0.50 packs/day    Types: Cigarettes  . Smokeless tobacco: Not on file  . Alcohol Use: No  . Drug Use: No  . Sexual Activity: No   Other Topics  Concern  . Not on file   Social History Narrative  . No narrative on file     ROS:  All 11 ROS were addressed and are negative except what is stated in the HPI   Physical Exam: Blood pressure 173/98, pulse 90, temperature 98.6 F (37 C), temperature source Oral, resp. rate 18, weight 86.501 kg (190 lb 11.2 oz), SpO2 99.00%.   General: Well developed, well nourished, in no acute distress Head: Eyes PERRLA, No xanthomas.   Normal cephalic and atramatic  Neck - JVP 3cm over clavicle (pt lying at 20deg) Lungs: rales midway up lungs Heart:   HRRR S1 S2 Pulses are 2+ & equal. No murmur, rubs, gallops.  No carotid bruit. No JVD.  No abdominal bruits.  Abdomen: Bowel sounds are positive, abdomen soft and non-tender without masses. No hepatosplenomegaly. Msk:  Back normal. Normal strength and tone for age. Extremities:  No clubbing, cyanosis or edema.  DP +1 Neuro: Alert and oriented X 3, non-focal, MAE x 4 GU: Deferred Rectal: Deferred Psych:  Good affect, responds appropriately      Labs: Lab Results  Component Value Date   WBC 7.4 02/08/2014   HGB 14.9 02/08/2014   HCT 44.6 02/08/2014   MCV 93.1 02/08/2014   PLT 177 02/08/2014  Recent Labs Lab 02/08/14 1950  NA 136*  K 4.2  CL 101  CO2 20  BUN 11  CREATININE 0.60  CALCIUM 9.3  GLUCOSE 102*   No results found for this basename: CKTOTAL, CKMB, TROPONINI,  in the last 72 hours No results found for this basename: CHOL, HDL, LDLCALC, TRIG   Lab Results  Component Value Date   DDIMER 1.40* 02/08/2014     Radiology:  Dg Chest 2 View  02/08/2014   CLINICAL DATA:  Shortness of Breath  EXAM: CHEST  2 VIEW  COMPARISON:  01/08/2007  FINDINGS: Cardiomediastinal silhouette is unremarkable. No acute infiltrate or pleural effusion. No pulmonary edema. Mild infrahilar bronchitic changes.  IMPRESSION: No segmental infiltrate or pulmonary edema. Mild infrahilar bronchitic changes.   Electronically Signed   By: Lahoma Crocker M.D.   On:  02/08/2014 19:45   Ct Angio Chest Pe W/cm &/or Wo Cm  02/08/2014   CLINICAL DATA:  Sudden onset of shortness of breath. Pulmonary edema.  EXAM: CT ANGIOGRAPHY CHEST WITH CONTRAST  TECHNIQUE: Multidetector CT imaging of the chest was performed using the standard protocol during bolus administration of intravenous contrast. Multiplanar CT image reconstructions and MIPs were obtained to evaluate the vascular anatomy.  CONTRAST:  122mL OMNIPAQUE IOHEXOL 350 MG/ML SOLN  COMPARISON:  Chest x-ray dated 02/08/2014  FINDINGS: There are no pulmonary emboli. The patient has diffuse interstitial pulmonary edema, most prominent at the lung bases. There is slight cardiomegaly with dilatation of the left ventricle. No effusions. The visualized portion of the upper abdomen is normal. No osseous abnormality  Review of the MIP images confirms the above findings.  IMPRESSION: Interstitial pulmonary edema. Borderline cardiomegaly. Left ventricle appears slightly dilated. No pulmonary emboli.   Electronically Signed   By: Rozetta Nunnery M.D.   On: 02/08/2014 21:41   Personally viewed.  EKG:  NSR, LVH with secondary QRS widening, LBBB  ASSESSMENT - Dyspnea, Pulmonary edema - Clinical Heart failure. Pt is well perfused.  - Hypertension - LBBB  PLAN:   - echocardiogram in the morning - risk stratify with lipid profile. - rule out ACS, cycle cardiac enzymes.  - Lasix 40IV BID with net goal 2L negative. Transition to po lasix at the time of discharge.  - start on asa 81mg , coreg 6.25mg  PO BID, lisinopril 10mg  po qd. Titrate up these medications prior to start hydralazine/imdur.  - UDS pending.   Thank you for this consult. We will continue to follow.    Orson Gear, MD  02/09/2014  3:41 AM

## 2014-02-09 NOTE — ED Notes (Signed)
Patient picked up by [facility]link  

## 2014-02-09 NOTE — Progress Notes (Signed)
Patient transferred from [facility] via [place] transport and Biomedical scientist to [facility] into room [phone]. Patient educated to the floor, the call bell system, and other room equipment. Educated patient to the Heart Failure floor and explained what it means to have CHF. Patient understood.  Telemetry Box and heart leads applied to patient and CMT tech made aware. Heart Failure Packet give to patient. Son is at the bedside. Currently complains of no pain or discomfort. Will continue to monitor patient to end of shift.

## 2014-02-09 NOTE — Progress Notes (Signed)
UR completed Kimber Esterly K. Jahnia Hewes, RN, BSN, [place], CCM  02/09/2014 3:27 PM

## 2014-02-09 NOTE — Progress Notes (Signed)
Echocardiogram 2D Echocardiogram has been performed.  Joelene Millin 02/09/2014, 10:43 AM

## 2014-02-09 NOTE — Progress Notes (Signed)
[facility] PROGRESS NOTE  Julia [name] HCW:[id number] DOB: [birth year] DOA: 02/08/2014  PCP: Does not have a PCP  Brief HPI: 58yo with PMH as below presented with shortness of breath and was found to have pulmonary edema. She is suspected to have CHF. She was admitted for further evaluation and management.  Past medical history:  Past Medical History  Diagnosis Date  . Environmental allergies   . Hypertension   . CHF (congestive heart failure)     New onset  . Shortness of breath     Consultants: Cardiology  Procedures:  2D ECHO is pending  Antibiotics: None  Subjective: Patient feels better this morning. Breathing is improved. No chest pain. Hasn't seen health care providers in 3 years.  Objective: Vital Signs  Filed Vitals:   02/09/14 0209 02/09/14 0648 02/09/14 0900 02/09/14 0954  BP: 173/98 148/92  133/80  Pulse: 90 78    Temp: 98.6 F (37 C) 98.3 F (36.8 C)    TempSrc: Oral Oral    Resp: 18 18    Height:   5\' 7"  (1.702 m)   Weight:      SpO2: 99% 94%      Intake/Output Summary (Last 24 hours) at 02/09/14 1036 Last data filed at 02/09/14 1006  Gross per 24 hour  Intake    243 ml  Output    300 ml  Net    -57 ml   Filed Weights   02/09/14 0208  Weight: 86.501 kg (190 lb 11.2 oz)    General appearance: alert, cooperative, appears stated age and no distress Resp: crackles bilaterally in bases. no wheezing Cardio: regular rate and rhythm, S1, S2 normal, no murmur, click, rub or gallop GI: soft, non-tender; bowel sounds normal; no masses,  no organomegaly Extremities: edema 1 + Neurologic: No focal deficits  Lab Results:  Basic Metabolic Panel:  Recent Labs Lab 02/08/14 1950 02/09/14 0550  NA 136* 141  K 4.2 3.5*  CL 101 102  CO2 20 25  GLUCOSE 102* 108*  BUN 11 9  CREATININE 0.60 0.65  CALCIUM 9.3 9.4   CBC:  Recent Labs Lab 02/08/14 1950  WBC 7.4  HGB 14.9  HCT 44.6  MCV 93.1  PLT 177   BNP (last 3  results)  Recent Labs  02/08/14 1951  PROBNP 2463.0*    Studies/Results: Dg Chest 2 View  02/08/2014   CLINICAL DATA:  Shortness of Breath  EXAM: CHEST  2 VIEW  COMPARISON:  01/08/2007  FINDINGS: Cardiomediastinal silhouette is unremarkable. No acute infiltrate or pleural effusion. No pulmonary edema. Mild infrahilar bronchitic changes.  IMPRESSION: No segmental infiltrate or pulmonary edema. Mild infrahilar bronchitic changes.   Electronically Signed   By: Lahoma Crocker M.D.   On: 02/08/2014 19:45   Ct Angio Chest Pe W/cm &/or Wo Cm  02/08/2014   CLINICAL DATA:  Sudden onset of shortness of breath. Pulmonary edema.  EXAM: CT ANGIOGRAPHY CHEST WITH CONTRAST  TECHNIQUE: Multidetector CT imaging of the chest was performed using the standard protocol during bolus administration of intravenous contrast. Multiplanar CT image reconstructions and MIPs were obtained to evaluate the vascular anatomy.  CONTRAST:  153mL OMNIPAQUE IOHEXOL 350 MG/ML SOLN  COMPARISON:  Chest x-ray dated 02/08/2014  FINDINGS: There are no pulmonary emboli. The patient has diffuse interstitial pulmonary edema, most prominent at the lung bases. There is slight cardiomegaly with dilatation of the left ventricle. No effusions. The visualized portion of the upper abdomen is normal. No  osseous abnormality  Review of the MIP images confirms the above findings.  IMPRESSION: Interstitial pulmonary edema. Borderline cardiomegaly. Left ventricle appears slightly dilated. No pulmonary emboli.   Electronically Signed   By: Rozetta Nunnery M.D.   On: 02/08/2014 21:41    Medications:  Scheduled: . carvedilol  6.25 mg Oral BID WC  . enoxaparin (LOVENOX) injection  40 mg Subcutaneous Daily  . furosemide  20 mg Intravenous Q12H  . lisinopril  2.5 mg Oral Daily  . potassium chloride  20 mEq Oral Daily  . sodium chloride  3 mL Intravenous Q12H   Continuous:  ZPH:XTAVWP chloride, acetaminophen, hydrALAZINE, ondansetron (ZOFRAN) IV, sodium  chloride  Assessment/Plan:  Principal Problem:   Acute CHF Active Problems:   TOBACCO DEPENDENCE   Essential hypertension, malignant   Tobacco abuse   LBBB (left bundle branch block)    Acute Pulmonary Edema Continue IV Lasix. Increase to 40mg  twice daily. ECHO is pending to determine type of CHF. Start intravenous furosemide 20mg  IV q 12 hr. Daily weights. Fluid restriction. Strict I/O. Cardiology is following.  Malignant HTN  BP much better. Continue current agents.   LBBB  No previous EKG for comparison. Follow up ECHO. Will likely need ischemic work up. Aspirin.  Tobacco abuse  Defers nicotine patch. Tobacco cessation discussed  Code Status: Full Code  DVT Prophylaxis: Enoxaparin    Family Communication: Discussed with patient and her daughter  Disposition Plan: Not ready for discharge. Mobilize as she improves.    LOS: 1 day   [facility] Pager [phone] 02/09/2014, 10:36 AM  If 8PM-8AM, please contact night-coverage at [email or web address], password [email or web address]   Disclaimer: This note was dictated with voice recognition software. Similar sounding words can inadvertently be transcribed and may not be corrected upon review.

## 2014-02-09 NOTE — Progress Notes (Signed)
DAILY PROGRESS NOTE  Subjective:  No events overnight. Breathing is much better. Can lay flat now.  Objective:  Temp:  [98.3 F (36.8 C)-98.7 F (37.1 C)] 98.3 F (36.8 C) (08/13 0648) Pulse Rate:  [74-106] 78 (08/13 0648) Resp:  [13-41] 18 (08/13 0648) BP: (148-197)/(92-137) 148/92 mmHg (08/13 0648) SpO2:  [92 %-99 %] 94 % (08/13 0648) Weight:  [190 lb 11.2 oz (86.501 kg)] 190 lb 11.2 oz (86.501 kg) (08/13 0208) Weight change:   Intake/Output from previous day: 08/12 0701 - 08/13 0700 In: -  Out: 300 [Urine:300]  Intake/Output from this shift: Total I/O In: 240 [P.O.:240] Out: -   Medications: Current Facility-Administered Medications  Medication Dose Route Frequency Provider Last Rate Last Dose  . 0.9 %  sodium chloride infusion  250 mL Intravenous PRN Orson Eva, MD      . acetaminophen (TYLENOL) tablet 650 mg  650 mg Oral Q4H PRN Orson Eva, MD      . carvedilol (COREG) tablet 6.25 mg  6.25 mg Oral BID WC Orson Eva, MD   6.25 mg at 02/09/14 0701  . enoxaparin (LOVENOX) injection 40 mg  40 mg Subcutaneous Daily Shanon Brow Tat, MD      . furosemide (LASIX) injection 20 mg  20 mg Intravenous Q12H David Tat, MD      . hydrALAZINE (APRESOLINE) injection 5 mg  5 mg Intravenous Q6H PRN Orson Eva, MD      . lisinopril (PRINIVIL,ZESTRIL) tablet 2.5 mg  2.5 mg Oral Daily Shanon Brow Tat, MD      . ondansetron [facility]) injection 4 mg  4 mg Intravenous Q6H PRN Orson Eva, MD      . potassium chloride SA (K-DUR,KLOR-CON) CR tablet 20 mEq  20 mEq Oral Daily David Tat, MD      . sodium chloride 0.9 % injection 3 mL  3 mL Intravenous Q12H Orson Eva, MD   3 mL at 02/09/14 0215  . sodium chloride 0.9 % injection 3 mL  3 mL Intravenous PRN Orson Eva, MD        Physical Exam: General appearance: alert and no distress Neck: no carotid bruit and no JVD Lungs: clear to auscultation bilaterally Heart: regular rate and rhythm, S1, S2 normal, S3 present and systolic murmur: early systolic 2/6,  blowing at apex Abdomen: soft, non-tender; bowel sounds normal; no masses,  no organomegaly Extremities: extremities normal, atraumatic, no cyanosis or edema Pulses: 2+ and symmetric Skin: Skin color, texture, turgor normal. No rashes or lesions Neurologic: Grossly normal Psych: Pleasant, normal mood, affect  Lab Results: Results for orders placed during the hospital encounter of 02/08/14 (from the past 48 hour(s))  CBC     Status: None   Collection Time    02/08/14  7:50 PM      Result Value Ref Range   WBC 7.4  4.0 - 10.5 K/uL   RBC 4.79  3.87 - 5.11 MIL/uL   Hemoglobin 14.9  12.0 - 15.0 g/dL   HCT 44.6  36.0 - 46.0 %   MCV 93.1  78.0 - 100.0 fL   MCH 31.1  26.0 - 34.0 pg   MCHC 33.4  30.0 - 36.0 g/dL   RDW 13.4  11.5 - 15.5 %   Platelets 177  150 - 400 K/uL  BASIC METABOLIC PANEL     Status: Abnormal   Collection Time    02/08/14  7:50 PM      Result Value Ref Range   Sodium 136 (*)  137 - 147 mEq/L   Potassium 4.2  3.7 - 5.3 mEq/L   Chloride 101  96 - 112 mEq/L   CO2 20  19 - 32 mEq/L   Glucose, Bld 102 (*) 70 - 99 mg/dL   BUN 11  6 - 23 mg/dL   Creatinine, Ser 0.60  0.50 - 1.10 mg/dL   Calcium 9.3  8.4 - 10.5 mg/dL   GFR calc non Af Amer >90  >90 mL/min   GFR calc Af Amer >90  >90 mL/min   Comment: (NOTE)     The eGFR has been calculated using the CKD EPI equation.     This calculation has not been validated in all clinical situations.     eGFR's persistently <90 mL/min signify possible Chronic Kidney     Disease.   Anion gap 15  5 - 15  D-DIMER, QUANTITATIVE     Status: Abnormal   Collection Time    02/08/14  7:50 PM      Result Value Ref Range   D-Dimer, Quant 1.40 (*) 0.00 - 0.48 ug/mL-FEU   Comment:            AT THE INHOUSE ESTABLISHED CUTOFF     VALUE OF 0.48 ug/mL FEU,     THIS ASSAY HAS BEEN DOCUMENTED     IN THE LITERATURE TO HAVE     A SENSITIVITY AND NEGATIVE     PREDICTIVE VALUE OF AT LEAST     98 TO 99%.  THE TEST RESULT     SHOULD BE  CORRELATED WITH     AN ASSESSMENT OF THE CLINICAL     PROBABILITY OF DVT / VTE.  PRO B NATRIURETIC PEPTIDE     Status: Abnormal   Collection Time    02/08/14  7:51 PM      Result Value Ref Range   Pro B Natriuretic peptide (BNP) 2463.0 (*) 0 - 125 pg/mL  I-STAT TROPOININ, ED     Status: None   Collection Time    02/08/14  8:03 PM      Result Value Ref Range   Troponin i, poc 0.00  0.00 - 0.08 ng/mL   Comment 3            Comment: Due to the release kinetics of cTnI,     a negative result within the first hours     of the onset of symptoms does not rule out     myocardial infarction with certainty.     If myocardial infarction is still suspected,     repeat the test at appropriate intervals.  URINALYSIS, ROUTINE W REFLEX MICROSCOPIC     Status: Abnormal   Collection Time    02/08/14  8:38 PM      Result Value Ref Range   Color, Urine YELLOW  YELLOW   APPearance CLEAR  CLEAR   Specific Gravity, Urine 1.014  1.005 - 1.030   pH 6.5  5.0 - 8.0   Glucose, UA NEGATIVE  NEGATIVE mg/dL   Hgb urine dipstick SMALL (*) NEGATIVE   Bilirubin Urine NEGATIVE  NEGATIVE   Ketones, ur NEGATIVE  NEGATIVE mg/dL   Protein, ur 30 (*) NEGATIVE mg/dL   Urobilinogen, UA 1.0  0.0 - 1.0 mg/dL   Nitrite NEGATIVE  NEGATIVE   Leukocytes, UA NEGATIVE  NEGATIVE  URINE MICROSCOPIC-ADD ON     Status: None   Collection Time    02/08/14  8:38 PM      Result  Value Ref Range   Squamous Epithelial / LPF RARE  RARE   WBC, UA 0-2  <3 WBC/hpf   RBC / HPF 0-2  <3 RBC/hpf  BASIC METABOLIC PANEL     Status: Abnormal   Collection Time    02/09/14  5:50 AM      Result Value Ref Range   Sodium 141  137 - 147 mEq/L   Potassium 3.5 (*) 3.7 - 5.3 mEq/L   Chloride 102  96 - 112 mEq/L   CO2 25  19 - 32 mEq/L   Glucose, Bld 108 (*) 70 - 99 mg/dL   BUN 9  6 - 23 mg/dL   Creatinine, Ser 0.65  0.50 - 1.10 mg/dL   Calcium 9.4  8.4 - 10.5 mg/dL   GFR calc non Af Amer >90  >90 mL/min   GFR calc Af Amer >90  >90 mL/min    Comment: (NOTE)     The eGFR has been calculated using the CKD EPI equation.     This calculation has not been validated in all clinical situations.     eGFR's persistently <90 mL/min signify possible Chronic Kidney     Disease.   Anion gap 14  5 - 15    Imaging: Dg Chest 2 View  02/08/2014   CLINICAL DATA:  Shortness of Breath  EXAM: CHEST  2 VIEW  COMPARISON:  01/08/2007  FINDINGS: Cardiomediastinal silhouette is unremarkable. No acute infiltrate or pleural effusion. No pulmonary edema. Mild infrahilar bronchitic changes.  IMPRESSION: No segmental infiltrate or pulmonary edema. Mild infrahilar bronchitic changes.   Electronically Signed   By: Lahoma Crocker M.D.   On: 02/08/2014 19:45   Ct Angio Chest Pe W/cm &/or Wo Cm  02/08/2014   CLINICAL DATA:  Sudden onset of shortness of breath. Pulmonary edema.  EXAM: CT ANGIOGRAPHY CHEST WITH CONTRAST  TECHNIQUE: Multidetector CT imaging of the chest was performed using the standard protocol during bolus administration of intravenous contrast. Multiplanar CT image reconstructions and MIPs were obtained to evaluate the vascular anatomy.  CONTRAST:  134m OMNIPAQUE IOHEXOL 350 MG/ML SOLN  COMPARISON:  Chest x-ray dated 02/08/2014  FINDINGS: There are no pulmonary emboli. The patient has diffuse interstitial pulmonary edema, most prominent at the lung bases. There is slight cardiomegaly with dilatation of the left ventricle. No effusions. The visualized portion of the upper abdomen is normal. No osseous abnormality  Review of the MIP images confirms the above findings.  IMPRESSION: Interstitial pulmonary edema. Borderline cardiomegaly. Left ventricle appears slightly dilated. No pulmonary emboli.   Electronically Signed   By: JRozetta NunneryM.D.   On: 02/08/2014 21:41    Assessment:  Principal Problem:   Acute CHF Active Problems:   TOBACCO DEPENDENCE   Essential hypertension, malignant   Tobacco abuse   LBBB (left bundle branch  block)   Plan:  1. Probably acute systolic congestive heart failure - I suspect uncontrolled HTN is the mechanism as the LV appears dilated on CXR. Awaiting echo today. Diuresing, however I's/O's not recorded accurately. Would continue IV lasix today. Check BNP in the am tomorrow. New LBBB - will need ischemia work-up. If she does indeed have a new cardiomyopathy, would favor LHC catheterization for definitive coronary evaluation during this hospitalization. She reports that she does not have health insurance. Will involve case management.  Time Spent Directly with Patient:  15 minutes  Length of Stay:  LOS: 1 day   KPixie Casino MD, FBaylor Medical Center At UptownAttending Cardiologist CShriners Hospitals For [facility]  [facility]  Sentoria Brent C 02/09/2014, 9:35 AM

## 2014-02-10 ENCOUNTER — Encounter (HOSPITAL_COMMUNITY)
Admission: EM | Disposition: A | Discharge status: Home / Self Care | Payer: Self-pay | Source: Home / Self Care | Attending: Internal Medicine

## 2014-02-10 ENCOUNTER — Encounter (HOSPITAL_COMMUNITY): Payer: Self-pay | Admitting: Physician Assistant

## 2014-02-10 DIAGNOSIS — I509 Heart failure, unspecified: Secondary | ICD-10-CM

## 2014-02-10 DIAGNOSIS — I340 Nonrheumatic mitral (valve) insufficiency: Secondary | ICD-10-CM | POA: Diagnosis present

## 2014-02-10 DIAGNOSIS — I5021 Acute systolic (congestive) heart failure: Principal | ICD-10-CM

## 2014-02-10 DIAGNOSIS — I10 Essential (primary) hypertension: Secondary | ICD-10-CM

## 2014-02-10 HISTORY — PX: LEFT HEART CATHETERIZATION WITH CORONARY ANGIOGRAM: SHX5451

## 2014-02-10 LAB — BASIC METABOLIC PANEL
Anion gap: 14 (ref 5–15)
BUN: 20 mg/dL (ref 6–23)
CALCIUM: 9.3 mg/dL (ref 8.4–10.5)
CO2: 26 mEq/L (ref 19–32)
CREATININE: 0.93 mg/dL (ref 0.50–1.10)
Chloride: 101 mEq/L (ref 96–112)
GFR calc Af Amer: 77 mL/min — ABNORMAL LOW (ref >90)
GFR, EST NON AFRICAN AMERICAN: 66 mL/min — AB (ref >90)
GLUCOSE: 97 mg/dL (ref 70–99)
Potassium: 4.3 mEq/L (ref 3.7–5.3)
SODIUM: 141 meq/L (ref 137–147)

## 2014-02-10 LAB — CBC
HEMATOCRIT: 51.4 % — AB (ref 36.0–46.0)
HEMOGLOBIN: 16.7 g/dL — AB (ref 12.0–15.0)
MCH: 30.9 pg (ref 26.0–34.0)
MCHC: 32.5 g/dL (ref 30.0–36.0)
MCV: 95 fL (ref 78.0–100.0)
Platelets: 180 10*3/uL (ref 150–400)
RBC: 5.41 MIL/uL — ABNORMAL HIGH (ref 3.87–5.11)
RDW: 13.7 % (ref 11.5–15.5)
WBC: 6.3 10*3/uL (ref 4.0–10.5)

## 2014-02-10 LAB — HEMOGLOBIN A1C
HEMOGLOBIN A1C: 5.8 % — AB (ref <5.7)
Mean Plasma Glucose: 120 mg/dL — ABNORMAL HIGH (ref <117)

## 2014-02-10 LAB — PRO B NATRIURETIC PEPTIDE: PRO B NATRI PEPTIDE: 767.3 pg/mL — AB (ref 0–125)

## 2014-02-10 LAB — PROTIME-INR
INR: 1.04 (ref 0.00–1.49)
PROTHROMBIN TIME: 13.6 s (ref 11.6–15.2)

## 2014-02-10 LAB — CREATININE, SERUM
Creatinine, Ser: 0.69 mg/dL (ref 0.50–1.10)
GFR calc Af Amer: >90 mL/min (ref >90)
GFR calc non Af Amer: >90 mL/min (ref >90)

## 2014-02-10 LAB — LIPID PANEL
Cholesterol: 204 mg/dL — ABNORMAL HIGH (ref 0–200)
HDL: 62 mg/dL (ref >39)
LDL CALC: 118 mg/dL — AB (ref 0–99)
Total CHOL/HDL Ratio: 3.3 RATIO
Triglycerides: 121 mg/dL (ref <150)
VLDL: 24 mg/dL (ref 0–40)

## 2014-02-10 SURGERY — LEFT HEART CATHETERIZATION WITH CORONARY ANGIOGRAM
Anesthesia: LOCAL

## 2014-02-10 MED ORDER — SODIUM CHLORIDE 0.9 % IJ SOLN: 3.0000 mL | Freq: Two times a day (BID) | INTRAMUSCULAR | Status: DC

## 2014-02-10 MED ORDER — SODIUM CHLORIDE 0.9 % IV SOLN
INTRAVENOUS | Status: DC
  Administered 2014-02-10: 12:00:00 via INTRAVENOUS

## 2014-02-10 MED ORDER — HEPARIN SODIUM (PORCINE) 1000 UNIT/ML IJ SOLN
INTRAMUSCULAR | Status: AC
  Filled 2014-02-10: qty 1

## 2014-02-10 MED ORDER — ASPIRIN 81 MG PO CHEW
81.0000 mg | CHEWABLE_TABLET | ORAL | Status: AC
  Administered 2014-02-10: 81 mg via ORAL
  Filled 2014-02-10: qty 1

## 2014-02-10 MED ORDER — HEPARIN (PORCINE) IN NACL 2-0.9 UNIT/ML-% IJ SOLN
INTRAMUSCULAR | Status: AC
  Filled 2014-02-10: qty 1000

## 2014-02-10 MED ORDER — SODIUM CHLORIDE 0.9 % IV SOLN
250.0000 mL | INTRAVENOUS | Status: DC | PRN
Start: 2014-02-10 — End: 2014-02-10

## 2014-02-10 MED ORDER — ENOXAPARIN SODIUM 40 MG/0.4ML ~~LOC~~ SOLN
40.0000 mg | SUBCUTANEOUS | Status: DC
  Administered 2014-02-11 – 2014-02-12 (×2): 40 mg via SUBCUTANEOUS
  Filled 2014-02-10 (×3): qty 0.4

## 2014-02-10 MED ORDER — SODIUM CHLORIDE 0.9 % IV SOLN: INTRAVENOUS | Status: AC

## 2014-02-10 MED ORDER — VERAPAMIL HCL 2.5 MG/ML IV SOLN
INTRAVENOUS | Status: AC
  Filled 2014-02-10: qty 2

## 2014-02-10 MED ORDER — LIDOCAINE HCL (PF) 1 % IJ SOLN
INTRAMUSCULAR | Status: AC
  Filled 2014-02-10: qty 30

## 2014-02-10 MED ORDER — SODIUM CHLORIDE 0.9 % IJ SOLN
3.0000 mL | INTRAMUSCULAR | Status: DC | PRN
Start: 2014-02-10 — End: 2014-02-10

## 2014-02-10 MED ORDER — FENTANYL CITRATE 0.05 MG/ML IJ SOLN
INTRAMUSCULAR | Status: AC
  Filled 2014-02-10: qty 2

## 2014-02-10 MED ORDER — MIDAZOLAM HCL 2 MG/2ML IJ SOLN
INTRAMUSCULAR | Status: AC
Start: 2014-02-10 — End: 2014-02-10
  Filled 2014-02-10: qty 2

## 2014-02-10 MED ORDER — MIDAZOLAM HCL 2 MG/2ML IJ SOLN
INTRAMUSCULAR | Status: AC
  Filled 2014-02-10: qty 2

## 2014-02-10 MED ORDER — OXYCODONE-ACETAMINOPHEN 5-325 MG PO TABS: 1.0000 | ORAL_TABLET | ORAL | Status: DC | PRN

## 2014-02-10 NOTE — H&P (View-Only) (Signed)
Patient Name: Julia [name] Date of Encounter: 02/10/2014     Principal Problem:   Acute CHF Active Problems:   Essential hypertension, malignant   Tobacco abuse   LBBB (left bundle branch block)   Mitral regurgitation   Systolic CHF    SUBJECTIVE  Feeling well. No CP or SOB. Wants to do cath. Ate this AM. Will now be NPO.  CURRENT MEDS . aspirin EC  81 mg Oral Daily  . carvedilol  6.25 mg Oral BID WC  . enoxaparin (LOVENOX) injection  40 mg Subcutaneous Daily  . furosemide  40 mg Intravenous Q12H  . lisinopril  5 mg Oral Daily  . potassium chloride  40 mEq Oral BID  . sodium chloride  3 mL Intravenous Q12H    OBJECTIVE  Filed Vitals:   02/09/14 1402 02/09/14 2029 02/10/14 0042 02/10/14 0508  BP: 123/78 120/77 124/83 111/73  Pulse: 64 74 68 74  Temp: 98.5 F (36.9 C) 98.7 F (37.1 C) 98.2 F (36.8 C) 98.3 F (36.8 C)  TempSrc: Oral Oral Oral Oral  Resp: 18 18 18 18   Height:      Weight:    190 lb 11.2 oz (86.5 kg)  SpO2: 95% 99% 94% 96%    Intake/Output Summary (Last 24 hours) at 02/10/14 0840 Last data filed at 02/10/14 0800  Gross per 24 hour  Intake    943 ml  Output   1600 ml  Net   -657 ml   Filed Weights   02/09/14 0208 02/10/14 0508  Weight: 190 lb 11.2 oz (86.501 kg) 190 lb 11.2 oz (86.5 kg)    PHYSICAL EXAM  General: Pleasant, NAD. Neuro: Alert and oriented X 3. Moves all extremities spontaneously. Psych: Normal affect. HEENT:  Normal  Neck: Supple without bruits or JVD. Lungs:  Resp regular and unlabored, CTA. Heart: RRR no s3, s4, or murmurs. Abdomen: Soft, non-tender, non-distended, BS + x 4.  Extremities: No clubbing, cyanosis or edema. DP/PT/Radials 2+ and equal bilaterally.  Accessory Clinical Findings  CBC  Recent Labs  02/08/14 1950  WBC 7.4  HGB 14.9  HCT 44.6  MCV 93.1  PLT 462   Basic Metabolic Panel  Recent Labs  02/09/14 0550 02/10/14 0424  NA 141 141  K 3.5* 4.3  CL 102 101  CO2 25 26  GLUCOSE  108* 97  BUN 9 20  CREATININE 0.65 0.93  CALCIUM 9.4 9.3    D-Dimer  Recent Labs  02/08/14 1950  DDIMER 1.40*    TELE  NSR with LBBB  Radiology/Studies  Dg Chest 2 View  02/08/2014   CLINICAL DATA:  Shortness of Breath  EXAM: CHEST  2 VIEW  COMPARISON:  01/08/2007  FINDINGS: Cardiomediastinal silhouette is unremarkable. No acute infiltrate or pleural effusion. No pulmonary edema. Mild infrahilar bronchitic changes.  IMPRESSION: No segmental infiltrate or pulmonary edema. Mild infrahilar bronchitic changes.     Ct Angio Chest Pe W/cm &/or Wo Cm  02/08/2014   CLINICAL DATA:  Sudden onset of shortness of breath. Pulmonary edema.  EXAM: CT ANGIOGRAPHY CHEST WITH CONTRAST  TECHNIQUE: Multidetector CT imaging of the chest was performed using the standard protocol during bolus administration of intravenous contrast. Multiplanar CT image reconstructions and MIPs were obtained to evaluate the vascular anatomy.  CONTRAST:  159mL OMNIPAQUE IOHEXOL 350 MG/ML SOLN  COMPARISON:  Chest x-ray dated 02/08/2014  FINDINGS: There are no pulmonary emboli. The patient has diffuse interstitial pulmonary edema, most prominent at the lung bases.  There is slight cardiomegaly with dilatation of the left ventricle. No effusions. The visualized portion of the upper abdomen is normal. No osseous abnormality  Review of the MIP images confirms the above findings.  IMPRESSION: Interstitial pulmonary edema. Borderline cardiomegaly. Left ventricle appears slightly dilated. No pulmonary emboli.      2D ECHO: 02/09/2014 LV EF: 25% - 30% Study Conclusions - Left ventricle: The cavity size was mildly dilated. There was moderate concentric hypertrophy. Systolic function was severely reduced. The estimated ejection fraction was in the range of 25% to 30%. Severe diffuse hypokinesis. Regional wall motion abnormalities cannot be excluded. - Ventricular septum: Septal motion showed mild dyssynergy. These changes are  consistent with a left bundle branch block. - Aortic valve: There was mild regurgitation. - Mitral valve: There was moderate to severe regurgitation. - Left atrium: The atrium was severely dilated.   ASSESSMENT AND PLAN  Julia [name] is a 58 y.o. female smoker with no past medical history by virtue of no medical care who presented to [facility] on 02/08/14 with a three-day history of shortness of breath and DOE and found to have a presumed new LBBB, malignant HTN and acute CHF.  Acute systolic CHF- 2D ECHO: 38/17/7116; EF 35-30%, mild LV dilation, severe, diffuse hypokinesis, regional WMAs cannot be excluded.  -- Suspected to be due to uncontrolled HTN as the LV appears dilated on CXR ( per Dr. Debara Pickett); however, with new onset cardiomyopathy ischemia will need to be ruled out. She is now NPO and planned for cath later this afternoon. She is feeling well and able to lie flat. If neg she can likely be discharged tomorrow on PO Lasix. MD to see.  -- On IV Lasix 40mg  BID. Net neg 957 mL, weight unchanged. Creat stable.    -- BNP 2463 --> 767 yesterday -- UDS negative. -- Cont ACE and BB.   Mitral regurgitation- severe by ECHO  New LBBB- will need an ischemic work up. Ventricular septum with mild dyssynergy c/w LBBB on ECHO  HTN- now well controlled on coreg 6.25mg  BID and lisinopril 5mg    Elevated D-Dimer- 1.4. CTA neg for PE.   Tobacco abuse- counseled on cessation.    * I will transfer her to our service now. I have talked to [place] with social work. They will work with her on insurance coverage as she may be medicaid eligible.    Tyrell Antonio PA-C  Pager [phone]

## 2014-02-10 NOTE — Progress Notes (Addendum)
[facility] PROGRESS NOTE  Julia [name] QMG:[id number] DOB: 1955-09-11 DOA: 02/08/2014  PCP: Does not have a PCP  Brief HPI: 58yo with PMH as below presented with shortness of breath and was found to have pulmonary edema. She was suspected to have CHF. She was admitted for further evaluation and management.  Past medical history:  Past Medical History  Diagnosis Date  . Hypertension   . Systolic CHF     a. 2D ECHO: 02/09/2014; EF 35-30%, mild LV dilation, severe, diffuse hypokinesis, regional WMAs cannot be excluded. Ventricular septum with mild dyssynergy c/w LBBB, mild AR, severe MR, severely dilated LA.  . Mitral regurgitation     a. severe by ECHO  01/2014  . Tobacco abuse     Consultants: Cardiology  Procedures:  2D ECHO  Study Conclusions - Left ventricle: The cavity size was mildly dilated. There was moderate concentric hypertrophy. Systolic function was severely reduced. The estimated ejection fraction was in the range of 25% to 30%. Severe diffuse hypokinesis. Regional wall motion abnormalities cannot be excluded. - Ventricular septum: Septal motion showed mild dyssynergy. These changes are consistent with a left bundle branch block. - Aortic valve: There was mild regurgitation. - Mitral valve: There was moderate to severe regurgitation. - Left atrium: The atrium was severely dilated.   Antibiotics: None  Subjective: Patient continues to feel better. Breathing is much improved. Denies chest pain. Hasn't seen health care providers in over 3 years.  Objective: Vital Signs  Filed Vitals:   02/09/14 1402 02/09/14 2029 02/10/14 0042 02/10/14 0508  BP: 123/78 120/77 124/83 111/73  Pulse: 64 74 68 74  Temp: 98.5 F (36.9 C) 98.7 F (37.1 C) 98.2 F (36.8 C) 98.3 F (36.8 C)  TempSrc: Oral Oral Oral Oral  Resp: 18 18 18 18   Height:      Weight:    86.5 kg (190 lb 11.2 oz)  SpO2: 95% 99% 94% 96%    Intake/Output Summary (Last 24 hours) at 02/10/14  0853 Last data filed at 02/10/14 0800  Gross per 24 hour  Intake    943 ml  Output   1600 ml  Net   -657 ml   Filed Weights   02/09/14 0208 02/10/14 0508  Weight: 86.501 kg (190 lb 11.2 oz) 86.5 kg (190 lb 11.2 oz)    General appearance: alert, cooperative, appears stated age and no distress Resp: Much improved air entry. Very few crackles at bases. no wheezing Cardio: regular rate and rhythm, S1, S2 normal, no murmur, click, rub or gallop GI: soft, non-tender; bowel sounds normal; no masses,  no organomegaly Extremities: edema 1 + Neurologic: No focal deficits  Lab Results:  Basic Metabolic Panel:  Recent Labs Lab 02/08/14 1950 02/09/14 0550 02/10/14 0424  NA 136* 141 141  K 4.2 3.5* 4.3  CL 101 102 101  CO2 20 25 26   GLUCOSE 102* 108* 97  BUN 11 9 20   CREATININE 0.60 0.65 0.93  CALCIUM 9.3 9.4 9.3   CBC:  Recent Labs Lab 02/08/14 1950  WBC 7.4  HGB 14.9  HCT 44.6  MCV 93.1  PLT 177   BNP (last 3 results)  Recent Labs  02/08/14 1951 02/10/14 0424  PROBNP 2463.0* 767.3*    Studies/Results: Dg Chest 2 View  02/08/2014   CLINICAL DATA:  Shortness of Breath  EXAM: CHEST  2 VIEW  COMPARISON:  01/08/2007  FINDINGS: Cardiomediastinal silhouette is unremarkable. No acute infiltrate or pleural effusion. No pulmonary edema. Mild  infrahilar bronchitic changes.  IMPRESSION: No segmental infiltrate or pulmonary edema. Mild infrahilar bronchitic changes.   Electronically Signed   By: Lahoma Crocker M.D.   On: 02/08/2014 19:45   Ct Angio Chest Pe W/cm &/or Wo Cm  02/08/2014   CLINICAL DATA:  Sudden onset of shortness of breath. Pulmonary edema.  EXAM: CT ANGIOGRAPHY CHEST WITH CONTRAST  TECHNIQUE: Multidetector CT imaging of the chest was performed using the standard protocol during bolus administration of intravenous contrast. Multiplanar CT image reconstructions and MIPs were obtained to evaluate the vascular anatomy.  CONTRAST:  111mL OMNIPAQUE IOHEXOL 350 MG/ML SOLN   COMPARISON:  Chest x-ray dated 02/08/2014  FINDINGS: There are no pulmonary emboli. The patient has diffuse interstitial pulmonary edema, most prominent at the lung bases. There is slight cardiomegaly with dilatation of the left ventricle. No effusions. The visualized portion of the upper abdomen is normal. No osseous abnormality  Review of the MIP images confirms the above findings.  IMPRESSION: Interstitial pulmonary edema. Borderline cardiomegaly. Left ventricle appears slightly dilated. No pulmonary emboli.   Electronically Signed   By: Rozetta Nunnery M.D.   On: 02/08/2014 21:41    Medications:  Scheduled: . aspirin EC  81 mg Oral Daily  . carvedilol  6.25 mg Oral BID WC  . enoxaparin (LOVENOX) injection  40 mg Subcutaneous Daily  . furosemide  40 mg Intravenous Q12H  . lisinopril  5 mg Oral Daily  . potassium chloride  40 mEq Oral BID  . sodium chloride  3 mL Intravenous Q12H   Continuous:  ELF:YBOFBP chloride, acetaminophen, hydrALAZINE, ondansetron (ZOFRAN) IV, sodium chloride  Assessment/Plan:  Principal Problem:   Acute systolic congestive heart failure Active Problems:   Acute CHF   Essential hypertension, malignant   Tobacco abuse   LBBB (left bundle branch block)   Mitral regurgitation   Systolic CHF    Acute Systolic CHF EF is about 10%. Remains on IV Lasix. Cards following and managing. Daily weights. Fluid restriction. Strict I/O. Monitor lytes.   Malignant HTN  BP much better. Continue current agents.   LBBB  No previous EKG for comparison. ECHO shows diffuse hypokinesis. Will likely need ischemic work up. Aspirin. Management per cards.  Tobacco abuse  Defers nicotine patch. Tobacco cessation discussed  Code Status: Full Code  DVT Prophylaxis: Enoxaparin    Family Communication: Discussed with patient  Disposition Plan: Discharge when cleared by cardiology. Mobilize as she improves.  Cardiology has taken over care as her issues are primarily cardiac.     LOS: 2 days   [facility] Pager [phone] 02/10/2014, 8:53 AM  If 8PM-8AM, please contact night-coverage at [email or web address], password [email or web address]   Disclaimer: This note was dictated with voice recognition software. Similar sounding words can inadvertently be transcribed and may not be corrected upon review.

## 2014-02-10 NOTE — Progress Notes (Signed)
CSW left a message for Julia [name] to follow up with patient re: possible need for Medicaid per request of MD. Patient has [place] listed as her insurance but states that she does not have insurance.  She will undergo a heart cath today per MD.  [facility] is aware of above. No further SW intervention is indicated at this time. Will be available to assist with any future needs if indicated.  CSW signing off.  Lorie Phenix. [name], [name]

## 2014-02-10 NOTE — Progress Notes (Signed)
Pt. Seen and examined. Agree with the NP/PA-C note as written.  Echo shows new cardiomyopathy with global hypokinesis, however LBBB. Suspect this will be non-ischemic cardiomyopathy, however, multivessel CAD cannot be excluded. Plan for LHC today. She seems clinically compensated on diuretics, no RHC is necessary.  Pixie Casino, MD, [facility] Attending Cardiologist [place]

## 2014-02-10 NOTE — Progress Notes (Signed)
Pt received to room 3E07 post heart catheterization.  Report to given to Marcie Bal, [place].  Assumed management of patient with TR Band and 1st assessment @ 1735.  TR band to right wrist; intact with 13cc air inflated and time on band for insertion of 13cc @ 1705.  Right radial pulse 2+ and arm and hand warm.  Patient alert and oriented and aware of limited movement of right wrist/arm.  No voiced complaints.  TR Band deflation in progress per P&P.

## 2014-02-10 NOTE — Progress Notes (Signed)
Heart Failure Navigator Consult Note  Presentation: Julia [name] is a 58 year old female without any known chronic medical problems presents with three-day history of shortness of breath and dyspnea on exertion. The patient stated that shortness of breath progressively got worse, and she experienced orthopnea and PND 2 days prior to this admission. She also noticed some ankle edema over the past 3 days. The patient states that she was told that she was "borderline hypertensive" 4 years ago. She is not on any medications. She denies taking any over-the-counter medications except for intermittent ibuprofen. She denies any fevers, chills, chest discomfort, dizziness, nausea, vomiting, diarrhea, abdominal pain, dysuria, hematuria, headache, visual disturbance.  In the emergency department, the patient had a chest x-ray reviewed revealed bilateral increased interstitial markings. D-dimer was elevated at 1.40. CT angiogram was obtained and was negative for pulmonary embolus but did show interstitial edema. Her BNP was 2463. Urinalysis was negative for any pyuria. The patient was cared furosemide 40 mg IV as well as labetalol 20 mg IV x1 due to elevated blood pressure of 197/115. BMP and CBC were essentially unremarkable.   Past Medical History  Diagnosis Date  . Hypertension   . Systolic CHF     a. 2D ECHO: 02/09/2014; EF 35-30%, mild LV dilation, severe, diffuse hypokinesis, regional WMAs cannot be excluded. Ventricular septum with mild dyssynergy c/w LBBB, mild AR, severe MR, severely dilated LA.  . Mitral regurgitation     a. severe by ECHO  01/2014  . Tobacco abuse     History   Social History  . Marital Status: Single    Spouse Name: N/A    Number of Children: N/A  . Years of Education: N/A   Social History Main Topics  . Smoking status: Current Every Day Smoker -- 0.50 packs/day    Types: Cigarettes  . Smokeless tobacco: None  . Alcohol Use: No  . Drug Use: No  . Sexual Activity: No    Other Topics Concern  . None   Social History Narrative  . None    ECHO:Study Conclusions--02/09/14  - Left ventricle: The cavity size was mildly dilated. There was moderate concentric hypertrophy. Systolic function was severely reduced. The estimated ejection fraction was in the range of 25% to 30%. Severe diffuse hypokinesis. Regional wall motion abnormalities cannot be excluded. - Ventricular septum: Septal motion showed mild dyssynergy. These changes are consistent with a left bundle branch block. - Aortic valve: There was mild regurgitation. - Mitral valve: There was moderate to severe regurgitation. - Left atrium: The atrium was severely dilated.  Transthoracic echocardiography. M-mode, complete 2D, spectral Doppler, and color Doppler. Birthdate: Patient birthdate: 18-Jan-1956. Age: Patient is 58 yr old. Sex: Gender: female. BMI: 29.8 kg/m^2. Blood pressure: 133/80 Patient status: Inpatient. Study date: Study date: 02/09/2014. Study time: 10:43 AM. Location: Bedside.    BNP    Component Value Date/Time   PROBNP 767.3* 02/10/2014 0424    Education Assessment and Provision:  Detailed education and instructions provided on heart failure disease management including the following:  Signs and symptoms of Heart Failure When to call the physician Importance of daily weights Low sodium diet Fluid restriction Medication management Anticipated future follow-up appointments  Patient education given on each of the above topics.  Patient acknowledges understanding and acceptance of all instructions.  I spoke with Ms. Julia [name] about her new diagnosis of HF.  She asked many pertinent questions and was very engaged in the conversation.  She is able to teach back topics  listed above.  She does have some concerns with affording medications as she was not taking any prescribed medications prior to admission and currently has no insurance.  She lives with her daughter and says that  transportation will not be an issue for appts.  Education Materials:  "Living Better With Heart Failure" Booklet, Daily Weight Tracker Tool and Heart Failure Educational Video.   High Risk Criteria for Readmission and/or Poor Patient Outcomes:  (Recommend Follow-up with [facility] Failure Clinic)--Yes-would benefit from AHF SW and will need assistance with medications.   EF <30%- Yes-25-30%  2 or more admissions in 6 months-  No- New HF  Difficult social situation- No  Demonstrates medication noncompliance- No  Barriers of Care:  Financial--no insurance, Knowledge-New HF  Discharge Planning:  Plans to discharge to home with daughter.  I will speak with care management regarding her medications.

## 2014-02-10 NOTE — CV Procedure (Signed)
     Left Heart Catheterization with Coronary Angiography  Report  Julia [name]  58 y.o.  female 1955-10-25  Procedure Date: 02/10/2014 Referring Physician: Mali Hilty, M.D. Primary Cardiologist: Mali Hilty, M.D.  INDICATIONS: Unexplained left heart failure and left bundle branch block  PROCEDURE: 1. Left heart catheterization; 2. Coronary angiography; 3. Left ventriculography  CONSENT:  The risks, benefits, and details of the procedure were explained in detail to the patient. Risks including death, stroke, heart attack, kidney injury, allergy, limb ischemia, bleeding and radiation injury were discussed.  The patient verbalized understanding and wanted to proceed.  Informed written consent was obtained.  PROCEDURE TECHNIQUE:  After Xylocaine anesthesia a 5 French Slender sheath was placed in the right radial artery with an angiocath and the modified Seldinger technique.  Coronary angiography was done using a 5 F JL 3.5 and JR 4 diagnostic catheter.  Left ventriculography was done using the JR 4 catheter and hand injection.   The digital images were reviewed and the case was terminated.   CONTRAST:  Total of 90 cc.  COMPLICATIONS:  None   HEMODYNAMICS:  Aortic pressure 109/76 mmHg; LV pressure 111/5 mmHg; LVEDP 11 mm mercury  ANGIOGRAPHIC DATA:   The left main coronary artery is normal.  The left anterior descending artery is widely patent. He gives origin to one large branching diagonal. The distal LAD is transapical..  The left circumflex artery gives origin to 2 obtuse marginal branches. The first is large and has the distribution of a ramus intermedius. There is a second very tiny obtuse marginal branch.  The right coronary artery is tortuous and dominant.Marland Kitchen  LEFT VENTRICULOGRAM:  Left ventricular angiogram was done in the 30 RAO projection and revealed mildly dilated LV cavity with an ejection fraction estimated at 35-40%   IMPRESSIONS:  1. Normal coronary arteries with  marked tortuosity suggesting long-standing hypertension 2. Mild to moderate left ventricular systolic dysfunction with estimated EF of 35-40%. Normal left ventricular hemodynamic recordings.   RECOMMENDATION:  Aggressive management of blood pressure with hopeful recovery of LV function over time.Marland Kitchen

## 2014-02-10 NOTE — H&P (View-Only) (Signed)
Pt. Seen and examined. Agree with the NP/PA-C note as written.  Echo shows new cardiomyopathy with global hypokinesis, however LBBB. Suspect this will be non-ischemic cardiomyopathy, however, multivessel CAD cannot be excluded. Plan for LHC today. She seems clinically compensated on diuretics, no RHC is necessary.  Pixie Casino, MD, [facility] Attending Cardiologist [place]

## 2014-02-10 NOTE — Progress Notes (Signed)
TR band removed, gauze and tegaderm placed. Site level 0. Pt educated on restrictions of wrist. VSS at this time. No complications at this time.

## 2014-02-10 NOTE — Progress Notes (Signed)
Pt a/o, no c/o pain, pt given education on heart failure and cath, pt stable

## 2014-02-10 NOTE — Interval H&P Note (Signed)
Cath Lab Visit (complete for each Cath Lab visit)  Clinical Evaluation Leading to the Procedure:   ACS: No.  Non-ACS:    Anginal Classification: No Symptoms  Anti-ischemic medical therapy: Minimal Therapy (1 class of medications)  Non-Invasive Test Results: No non-invasive testing performed  Prior CABG: No previous CABG      History and Physical Interval Note:  5/92/9244 6:28 PM  Julia [name]  has presented today for surgery, with the diagnosis of new cardiomyopathy  The various methods of treatment have been discussed with the patient and family. After consideration of risks, benefits and other options for treatment, the patient has consented to  Procedure(s): LEFT HEART CATHETERIZATION WITH CORONARY ANGIOGRAM (N/A) as a surgical intervention .  The patient's history has been reviewed, patient examined, no change in status, stable for surgery.  I have reviewed the patient's chart and labs.  Questions were answered to the patient's satisfaction.     Sinclair Grooms

## 2014-02-10 NOTE — Progress Notes (Signed)
Patient Name: Julia [name] Date of Encounter: 02/10/2014     Principal Problem:   Acute CHF Active Problems:   Essential hypertension, malignant   Tobacco abuse   LBBB (left bundle branch block)   Mitral regurgitation   Systolic CHF    SUBJECTIVE  Feeling well. No CP or SOB. Wants to do cath. Ate this AM. Will now be NPO.  CURRENT MEDS . aspirin EC  81 mg Oral Daily  . carvedilol  6.25 mg Oral BID WC  . enoxaparin (LOVENOX) injection  40 mg Subcutaneous Daily  . furosemide  40 mg Intravenous Q12H  . lisinopril  5 mg Oral Daily  . potassium chloride  40 mEq Oral BID  . sodium chloride  3 mL Intravenous Q12H    OBJECTIVE  Filed Vitals:   02/09/14 1402 02/09/14 2029 02/10/14 0042 02/10/14 0508  BP: 123/78 120/77 124/83 111/73  Pulse: 64 74 68 74  Temp: 98.5 F (36.9 C) 98.7 F (37.1 C) 98.2 F (36.8 C) 98.3 F (36.8 C)  TempSrc: Oral Oral Oral Oral  Resp: 18 18 18 18   Height:      Weight:    190 lb 11.2 oz (86.5 kg)  SpO2: 95% 99% 94% 96%    Intake/Output Summary (Last 24 hours) at 02/10/14 0840 Last data filed at 02/10/14 0800  Gross per 24 hour  Intake    943 ml  Output   1600 ml  Net   -657 ml   Filed Weights   02/09/14 0208 02/10/14 0508  Weight: 190 lb 11.2 oz (86.501 kg) 190 lb 11.2 oz (86.5 kg)    PHYSICAL EXAM  General: Pleasant, NAD. Neuro: Alert and oriented X 3. Moves all extremities spontaneously. Psych: Normal affect. HEENT:  Normal  Neck: Supple without bruits or JVD. Lungs:  Resp regular and unlabored, CTA. Heart: RRR no s3, s4, or murmurs. Abdomen: Soft, non-tender, non-distended, BS + x 4.  Extremities: No clubbing, cyanosis or edema. DP/PT/Radials 2+ and equal bilaterally.  Accessory Clinical Findings  CBC  Recent Labs  02/08/14 1950  WBC 7.4  HGB 14.9  HCT 44.6  MCV 93.1  PLT 267   Basic Metabolic Panel  Recent Labs  02/09/14 0550 02/10/14 0424  NA 141 141  K 3.5* 4.3  CL 102 101  CO2 25 26  GLUCOSE  108* 97  BUN 9 20  CREATININE 0.65 0.93  CALCIUM 9.4 9.3    D-Dimer  Recent Labs  02/08/14 1950  DDIMER 1.40*    TELE  NSR with LBBB  Radiology/Studies  Dg Chest 2 View  02/08/2014   CLINICAL DATA:  Shortness of Breath  EXAM: CHEST  2 VIEW  COMPARISON:  01/08/2007  FINDINGS: Cardiomediastinal silhouette is unremarkable. No acute infiltrate or pleural effusion. No pulmonary edema. Mild infrahilar bronchitic changes.  IMPRESSION: No segmental infiltrate or pulmonary edema. Mild infrahilar bronchitic changes.     Ct Angio Chest Pe W/cm &/or Wo Cm  02/08/2014   CLINICAL DATA:  Sudden onset of shortness of breath. Pulmonary edema.  EXAM: CT ANGIOGRAPHY CHEST WITH CONTRAST  TECHNIQUE: Multidetector CT imaging of the chest was performed using the standard protocol during bolus administration of intravenous contrast. Multiplanar CT image reconstructions and MIPs were obtained to evaluate the vascular anatomy.  CONTRAST:  145mL OMNIPAQUE IOHEXOL 350 MG/ML SOLN  COMPARISON:  Chest x-ray dated 02/08/2014  FINDINGS: There are no pulmonary emboli. The patient has diffuse interstitial pulmonary edema, most prominent at the lung bases.  There is slight cardiomegaly with dilatation of the left ventricle. No effusions. The visualized portion of the upper abdomen is normal. No osseous abnormality  Review of the MIP images confirms the above findings.  IMPRESSION: Interstitial pulmonary edema. Borderline cardiomegaly. Left ventricle appears slightly dilated. No pulmonary emboli.      2D ECHO: 02/09/2014 LV EF: 25% - 30% Study Conclusions - Left ventricle: The cavity size was mildly dilated. There was moderate concentric hypertrophy. Systolic function was severely reduced. The estimated ejection fraction was in the range of 25% to 30%. Severe diffuse hypokinesis. Regional wall motion abnormalities cannot be excluded. - Ventricular septum: Septal motion showed mild dyssynergy. These changes are  consistent with a left bundle branch block. - Aortic valve: There was mild regurgitation. - Mitral valve: There was moderate to severe regurgitation. - Left atrium: The atrium was severely dilated.   ASSESSMENT AND PLAN  Julia [name] is a 58 y.o. female smoker with no past medical history by virtue of no medical care who presented to [facility] on 02/08/14 with a three-day history of shortness of breath and DOE and found to have a presumed new LBBB, malignant HTN and acute CHF.  Acute systolic CHF- 2D ECHO: 74/25/9563; EF 35-30%, mild LV dilation, severe, diffuse hypokinesis, regional WMAs cannot be excluded.  -- Suspected to be due to uncontrolled HTN as the LV appears dilated on CXR ( per Dr. Debara Pickett); however, with new onset cardiomyopathy ischemia will need to be ruled out. She is now NPO and planned for cath later this afternoon. She is feeling well and able to lie flat. If neg she can likely be discharged tomorrow on PO Lasix. MD to see.  -- On IV Lasix 40mg  BID. Net neg 957 mL, weight unchanged. Creat stable.    -- BNP 2463 --> 767 yesterday -- UDS negative. -- Cont ACE and BB.   Mitral regurgitation- severe by ECHO  New LBBB- will need an ischemic work up. Ventricular septum with mild dyssynergy c/w LBBB on ECHO  HTN- now well controlled on coreg 6.25mg  BID and lisinopril 5mg    Elevated D-Dimer- 1.4. CTA neg for PE.   Tobacco abuse- counseled on cessation.    * I will transfer her to our service now. I have talked to [place] with social work. They will work with her on insurance coverage as she may be medicaid eligible.    Julia Antonio PA-C  Pager [phone]

## 2014-02-11 DIAGNOSIS — I447 Left bundle-branch block, unspecified: Secondary | ICD-10-CM

## 2014-02-11 DIAGNOSIS — J811 Chronic pulmonary edema: Secondary | ICD-10-CM

## 2014-02-11 LAB — BASIC METABOLIC PANEL
Anion gap: 15 (ref 5–15)
BUN: 17 mg/dL (ref 6–23)
CALCIUM: 9.2 mg/dL (ref 8.4–10.5)
CHLORIDE: 100 meq/L (ref 96–112)
CO2: 21 mEq/L (ref 19–32)
Creatinine, Ser: 0.72 mg/dL (ref 0.50–1.10)
GFR calc Af Amer: >90 mL/min (ref >90)
GFR calc non Af Amer: >90 mL/min (ref >90)
Glucose, Bld: 87 mg/dL (ref 70–99)
Potassium: 5.6 mEq/L — ABNORMAL HIGH (ref 3.7–5.3)
Sodium: 136 mEq/L — ABNORMAL LOW (ref 137–147)

## 2014-02-11 MED ORDER — FUROSEMIDE 40 MG PO TABS
40.0000 mg | ORAL_TABLET | Freq: Two times a day (BID) | ORAL | Status: DC
  Administered 2014-02-11 – 2014-02-12 (×2): 40 mg via ORAL
  Filled 2014-02-11 (×4): qty 1

## 2014-02-11 MED ORDER — NICOTINE 14 MG/24HR TD PT24
14.0000 mg | MEDICATED_PATCH | Freq: Every day | TRANSDERMAL | Status: DC
  Administered 2014-02-11 – 2014-02-12 (×2): 14 mg via TRANSDERMAL
  Filled 2014-02-11 (×2): qty 1

## 2014-02-11 MED ORDER — HYDRALAZINE HCL 25 MG PO TABS
25.0000 mg | ORAL_TABLET | Freq: Three times a day (TID) | ORAL | Status: DC
  Administered 2014-02-11 – 2014-02-12 (×2): 25 mg via ORAL
  Filled 2014-02-11 (×5): qty 1

## 2014-02-11 NOTE — Progress Notes (Signed)
Patient Name: Julia [name] Date of Encounter: 02/11/2014     Principal Problem:   Acute systolic congestive heart failure Active Problems:   Essential hypertension, malignant   Tobacco abuse   LBBB (left bundle branch block)   Mitral regurgitation    SUBJECTIVE     CURRENT MEDS . aspirin EC  81 mg Oral Daily  . carvedilol  6.25 mg Oral BID WC  . enoxaparin (LOVENOX) injection  40 mg Subcutaneous Q24H  . furosemide  40 mg Intravenous Q12H  . lisinopril  5 mg Oral Daily  . potassium chloride  40 mEq Oral BID    OBJECTIVE  Filed Vitals:   02/10/14 2307 02/11/14 0147 02/11/14 0606 02/11/14 0900  BP: 130/80 107/62 117/73 104/62  Pulse:  68 77   Temp:  97.8 F (36.6 C) 98.4 F (36.9 C)   TempSrc:  Oral Oral   Resp:  18 18   Height:      Weight:   188 lb 11.8 oz (85.61 kg)   SpO2:  93% 98%     Intake/Output Summary (Last 24 hours) at 02/11/14 1041 Last data filed at 02/11/14 1000  Gross per 24 hour  Intake 1687.5 ml  Output   2700 ml  Net -1012.5 ml   Filed Weights   02/09/14 0208 02/10/14 0508 02/11/14 0606  Weight: 190 lb 11.2 oz (86.501 kg) 190 lb 11.2 oz (86.5 kg) 188 lb 11.8 oz (85.61 kg)    PHYSICAL EXAM  General: Pleasant, NAD. Neuro: Alert and oriented X 3. Moves all extremities spontaneously. Psych: Normal affect. HEENT:  Normal  Neck: Supple without bruits or JVD. Lungs:  Resp regular and unlabored, CTA. Heart: RRR no s3, s4, or murmurs. Abdomen: Soft, non-tender, non-distended, BS + x 4.  Extremities: No clubbing, cyanosis or edema. DP/PT/Radials 2+ and equal bilaterally.  Accessory Clinical Findings  CBC  Recent Labs  02/08/14 1950 02/10/14 1830  WBC 7.4 6.3  HGB 14.9 16.7*  HCT 44.6 51.4*  MCV 93.1 95.0  PLT 177 832   Basic Metabolic Panel  Recent Labs  02/10/14 0424 02/10/14 1830 02/11/14 0337  NA 141  --  136*  K 4.3  --  5.6*  CL 101  --  100  CO2 26  --  21  GLUCOSE 97  --  87  BUN 20  --  17  CREATININE  0.93 0.69 0.72  CALCIUM 9.3  --  9.2    D-Dimer  Recent Labs  02/08/14 1950  DDIMER 1.40*    TELE  NSR with LBBB  Radiology/Studies  Dg Chest 2 View  02/08/2014   CLINICAL DATA:  Shortness of Breath  EXAM: CHEST  2 VIEW  COMPARISON:  01/08/2007  FINDINGS: Cardiomediastinal silhouette is unremarkable. No acute infiltrate or pleural effusion. No pulmonary edema. Mild infrahilar bronchitic changes.  IMPRESSION: No segmental infiltrate or pulmonary edema. Mild infrahilar bronchitic changes.     Ct Angio Chest Pe W/cm &/or Wo Cm  02/08/2014   CLINICAL DATA:  Sudden onset of shortness of breath. Pulmonary edema.  EXAM: CT ANGIOGRAPHY CHEST WITH CONTRAST  TECHNIQUE: Multidetector CT imaging of the chest was performed using the standard protocol during bolus administration of intravenous contrast. Multiplanar CT image reconstructions and MIPs were obtained to evaluate the vascular anatomy.  CONTRAST:  159mL OMNIPAQUE IOHEXOL 350 MG/ML SOLN  COMPARISON:  Chest x-ray dated 02/08/2014  FINDINGS: There are no pulmonary emboli. The patient has diffuse interstitial pulmonary edema, most prominent at  the lung bases. There is slight cardiomegaly with dilatation of the left ventricle. No effusions. The visualized portion of the upper abdomen is normal. No osseous abnormality  Review of the MIP images confirms the above findings.  IMPRESSION: Interstitial pulmonary edema. Borderline cardiomegaly. Left ventricle appears slightly dilated. No pulmonary emboli.      2D ECHO: 02/09/2014 LV EF: 25% - 30% Study Conclusions - Left ventricle: The cavity size was mildly dilated. There was moderate concentric hypertrophy. Systolic function was severely reduced. The estimated ejection fraction was in the range of 25% to 30%. Severe diffuse hypokinesis. Regional wall motion abnormalities cannot be excluded. - Ventricular septum: Septal motion showed mild dyssynergy. These changes are consistent with a left bundle  branch block. - Aortic valve: There was mild regurgitation. - Mitral valve: There was moderate to severe regurgitation. - Left atrium: The atrium was severely dilated.   Left Heart Catheterization with Coronary Angiography Report  LEFT VENTRICULOGRAM: Left ventricular angiogram was done in the 30 RAO projection and revealed mildly dilated LV cavity with an ejection fraction estimated at 35-40%  IMPRESSIONS: 1. Normal coronary arteries with marked tortuosity suggesting long-standing hypertension  2. Mild to moderate left ventricular systolic dysfunction with estimated EF of 35-40%. Normal left ventricular hemodynamic recordings.  RECOMMENDATION: Aggressive management of blood pressure with hopeful recovery of LV function over time..     ASSESSMENT AND PLAN  Julia [name] is a 58 y.o. female smoker with no past medical history by virtue of no medical care who presented to [facility] on 02/08/14 with a three-day history of shortness of breath and DOE and found to have a presumed new LBBB, malignant HTN and acute CHF.  Acute systolic CHF/Non-ischemic CM- EF approximately 30% Transition to PO diuretics. Hold K supplement. Discussed Na restriction. Daily weight monitoring  Mitral regurgitation- severe by ECHO  Chronic LBBB- . Ventricular septum with mild dyssynergy c/w LBBB on ECHO  HTN- BP now low - no plan for additional CHF med titration for a couple of weeks   Elevated D-Dimer- 1.4. CTA neg for PE.    Tobacco abuse- counseled on cessation.   Hyperkalemia- K 5.6. Will discontinue her K supplementation.   Pre-Diabetes- elevated HgA1c- 5.8 -- Diet and exercise.   Tyrell Antonio PA-C  Pager [phone]  I have seen and examined the patient along with Perry Mount PA-C.  I have reviewed the chart, notes and new data.  I agree with PA's note.  Key new complaints: no dyspnea at rest or walking to bathroom, not dizzy Key examination changes: BP relatively low Key new findings /  data: K 5.6  PLAN: Transition to PO diuretics. Recheck K. Educated re: Na restriction, medication compliance, sign/symptoms of acute HF exacerbation, daily weight monitoring. Reevaluate EF in 90 days to assess need for CRT-D device therapy.  Possible DC tomorrow will need early f/u (transition of care) in 1-2 weeks.  Sanda Klein, MD, [place] [phone] 02/11/2014, 11:10 AM

## 2014-02-12 DIAGNOSIS — E875 Hyperkalemia: Secondary | ICD-10-CM | POA: Diagnosis not present

## 2014-02-12 DIAGNOSIS — I428 Other cardiomyopathies: Secondary | ICD-10-CM

## 2014-02-12 DIAGNOSIS — IMO0001 Reserved for inherently not codable concepts without codable children: Secondary | ICD-10-CM

## 2014-02-12 DIAGNOSIS — F17200 Nicotine dependence, unspecified, uncomplicated: Secondary | ICD-10-CM

## 2014-02-12 DIAGNOSIS — I059 Rheumatic mitral valve disease, unspecified: Secondary | ICD-10-CM

## 2014-02-12 DIAGNOSIS — I10 Essential (primary) hypertension: Secondary | ICD-10-CM

## 2014-02-12 DIAGNOSIS — Z8679 Personal history of other diseases of the circulatory system: Secondary | ICD-10-CM | POA: Diagnosis present

## 2014-02-12 DIAGNOSIS — Z0389 Encounter for observation for other suspected diseases and conditions ruled out: Secondary | ICD-10-CM

## 2014-02-12 LAB — BASIC METABOLIC PANEL
Anion gap: 15 (ref 5–15)
BUN: 18 mg/dL (ref 6–23)
CO2: 22 mEq/L (ref 19–32)
Calcium: 9.9 mg/dL (ref 8.4–10.5)
Chloride: 100 mEq/L (ref 96–112)
Creatinine, Ser: 0.72 mg/dL (ref 0.50–1.10)
GFR calc Af Amer: >90 mL/min (ref >90)
GFR calc non Af Amer: >90 mL/min (ref >90)
Glucose, Bld: 106 mg/dL — ABNORMAL HIGH (ref 70–99)
Potassium: 4.5 mEq/L (ref 3.7–5.3)
Sodium: 137 mEq/L (ref 137–147)

## 2014-02-12 MED ORDER — ACETAMINOPHEN 325 MG PO TABS: 650.0000 mg | ORAL_TABLET | ORAL | Status: DC | PRN

## 2014-02-12 MED ORDER — LOSARTAN POTASSIUM 50 MG PO TABS: 50.0000 mg | ORAL_TABLET | Freq: Every day | ORAL | Status: DC

## 2014-02-12 MED ORDER — NICOTINE 14 MG/24HR TD PT24
14.0000 mg | MEDICATED_PATCH | Freq: Every day | TRANSDERMAL | Status: DC
Start: 2014-02-12 — End: 2014-02-21

## 2014-02-12 MED ORDER — CARVEDILOL 6.25 MG PO TABS: 6.2500 mg | ORAL_TABLET | Freq: Two times a day (BID) | ORAL | Status: DC

## 2014-02-12 MED ORDER — ASPIRIN 81 MG PO TBEC: 81.0000 mg | DELAYED_RELEASE_TABLET | Freq: Every day | ORAL | Status: DC

## 2014-02-12 MED ORDER — FUROSEMIDE 40 MG PO TABS: 40.0000 mg | ORAL_TABLET | Freq: Two times a day (BID) | ORAL | Status: DC

## 2014-02-12 NOTE — Plan of Care (Signed)
Problem: Phase I Progression Outcomes Goal: EF % per last Echo/documented,Core Reminder form on chart Outcome: Completed/Met Date Met:  02/12/14 EF 25-30%(02-09-14)

## 2014-02-12 NOTE — Progress Notes (Signed)
    Subjective:  She has a cough, mildly productive- probably from not smokin x 48 hrs.  Objective:  Vital Signs in the last 24 hours: Temp:  [97.7 F (36.5 C)-98 F (36.7 C)] 98 F (36.7 C) (08/16 0514) Pulse Rate:  [68-81] 78 (08/16 0514) Resp:  [18-20] 18 (08/16 0514) BP: (104-154)/(60-81) 117/69 mmHg (08/16 0514) SpO2:  [96 %-98 %] 96 % (08/16 0514) Weight:  [190 lb 3.2 oz (86.274 kg)] 190 lb 3.2 oz (86.274 kg) (08/16 0514)  Intake/Output from previous day:  Intake/Output Summary (Last 24 hours) at 02/12/14 0834 Last data filed at 02/12/14 0516  Gross per 24 hour  Intake    820 ml  Output   2000 ml  Net  -1180 ml    Physical Exam: General appearance: alert, cooperative and no distress Lungs: decreased breath sounds but clear Heart: regular rate and rhythm and 2/6 systolic murmur   Rate: 78  Rhythm: normal sinus rhythm  Lab Results:  Recent Labs  02/10/14 1830  WBC 6.3  HGB 16.7*  PLT 180    Recent Labs  02/10/14 0424 02/10/14 1830 02/11/14 0337  NA 141  --  136*  K 4.3  --  5.6*  CL 101  --  100  CO2 26  --  21  GLUCOSE 97  --  87  BUN 20  --  17  CREATININE 0.93 0.69 0.72   No results found for this basename: TROPONINI, CK, MB,  in the last 72 hours  Recent Labs  02/10/14 1800  INR 1.04    Imaging: Imaging results have been reviewed  Cardiac Studies:  Assessment/Plan:  58 y.o. female smoker with no past medical history by virtue of no medical care who presented to [facility] on 02/08/14 with a three-day history of shortness of breath and DOE and found to have a  LBBB, malignant HTN and acute CHF. EF was 35% with MR by echo. Cath revealed normal coronaries.    Principal Problem:   Acute systolic congestive heart failure Active Problems:   DCM (dilated cardiomyopathy)- EF 35-40% by echo   Essential hypertension, malignant   Mitral regurgitation   Tobacco abuse   LBBB (left bundle branch block)   Hyperkalemia    PLAN: Will review with  MD- ? Home on ARB if her K+ is OK instead of Hydralazine Q8, suspect she will be more compliant with a once or twice a day drug.  I ordered repeat BMP (K+ 5.6 yesterday).   Kerin Ransom PA-C Beeper [phone] 02/12/2014, 8:34 AM  I have seen and examined the patient along with Kerin Ransom PA-C.  I have reviewed the chart, notes and new data.  I agree with PA's note.  Key new complaints: still a little dyspneic, overall improved Key examination changes: no JVD, no S3, no edema and no rales Key new findings / data: labs pending  PLAN: Agree with ARB rather than hydralazine for improved compliance, as long as K is better. DC later today. If EF does not improve, she is an excellent candidate for CRT (female, nonischemic CMP, LBBB, QRS>150 ms).  Sanda Klein, MD, [place] [phone] 02/12/2014, 9:20 AM

## 2014-02-12 NOTE — Discharge Instructions (Signed)

## 2014-02-12 NOTE — Discharge Summary (Signed)
Patient ID: Julia [name],  MRN: [id number], DOB/AGE: September 29, 1955 58 y.o.  Admit date: 02/08/2014 Discharge date: 02/12/2014  Primary Care Provider: None Primary Cardiologist: Dr Debara Pickett  Discharge Diagnoses Principal Problem:   Acute systolic congestive heart failure Active Problems:   DCM (dilated cardiomyopathy)- EF 35-40% by echo   Essential hypertension, malignant   Mitral regurgitation   Tobacco abuse   LBBB (left bundle branch block)   Hyperkalemia   Normal coronary arteries    Procedures:  Cardiac Cath 02/10/14   Hospital Course:  58 y.o. female smoker with no past medical history by virtue of no medical care, presented to [facility] on 02/08/14 with a three-day history of shortness of breath and DOE and found to have a LBBB, malignant HTN and acute CHF. EF was 35% with MR by echo. Cath revealed normal coronaries. She was diuresed 2.8 L and improved symptomatically. She will be discharged later on the 16th pending her BMP. She will need TCM follow up.    Discharge Vitals:  Blood pressure 117/69, pulse 78, temperature 98 F (36.7 C), temperature source Oral, resp. rate 18, height 5\' 7"  (1.702 m), weight 190 lb 3.2 oz (86.274 kg), SpO2 96.00%.    Labs: Results for orders placed during the hospital encounter of 02/08/14 (from the past 24 hour(s))  BASIC METABOLIC PANEL     Status: Abnormal   Collection Time    02/12/14  9:35 AM      Result Value Ref Range   Sodium 137  137 - 147 mEq/L   Potassium 4.5  3.7 - 5.3 mEq/L   Chloride 100  96 - 112 mEq/L   CO2 22  19 - 32 mEq/L   Glucose, Bld 106 (*) 70 - 99 mg/dL   BUN 18  6 - 23 mg/dL   Creatinine, Ser 0.72  0.50 - 1.10 mg/dL   Calcium 9.9  8.4 - 10.5 mg/dL   GFR calc non Af Amer >90  >90 mL/min   GFR calc Af Amer >90  >90 mL/min   Anion gap 15  5 - 15    Disposition:      Follow-up Information   Follow up with HILTY,Kenneth C, MD. (office will call you)    Specialty:  Cardiology   Contact information:   [place] [address] [phone]       Discharge Medications:    Medication List         acetaminophen 325 MG tablet  Commonly known as:  TYLENOL  Take 2 tablets (650 mg total) by mouth every 4 (four) hours as needed for headache or mild pain.     aspirin 81 MG EC tablet  Take 1 tablet (81 mg total) by mouth daily.     carvedilol 6.25 MG tablet  Commonly known as:  COREG  Take 1 tablet (6.25 mg total) by mouth 2 (two) times daily with a meal.     furosemide 40 MG tablet  Commonly known as:  LASIX  Take 1 tablet (40 mg total) by mouth 2 (two) times daily.     losartan 50 MG tablet  Commonly known as:  COZAAR  Take 1 tablet (50 mg total) by mouth daily.     nicotine 14 mg/24hr patch  Commonly known as:  NICODERM CQ - dosed in mg/24 hours  Place 1 patch (14 mg total) onto the skin daily.         Duration of Discharge Encounter:  Greater than 30 minutes including physician time.  Angelena Form PA-C 02/12/2014 11:39 AM

## 2014-02-16 ENCOUNTER — Telehealth: Payer: Self-pay | Admitting: Internal Medicine

## 2014-02-17 NOTE — Telephone Encounter (Signed)
Closed encounter °

## 2014-02-21 ENCOUNTER — Ambulatory Visit (INDEPENDENT_AMBULATORY_CARE_PROVIDER_SITE_OTHER): Payer: Medicaid Other | Admitting: Nurse Practitioner

## 2014-02-21 ENCOUNTER — Encounter: Payer: Self-pay | Admitting: Nurse Practitioner

## 2014-02-21 VITALS — BP 160/100 | HR 61 | Ht 66.5 in | Wt 194.4 lb

## 2014-02-21 DIAGNOSIS — R0609 Other forms of dyspnea: Secondary | ICD-10-CM

## 2014-02-21 DIAGNOSIS — I5022 Chronic systolic (congestive) heart failure: Secondary | ICD-10-CM

## 2014-02-21 DIAGNOSIS — R0600 Dyspnea, unspecified: Secondary | ICD-10-CM

## 2014-02-21 DIAGNOSIS — I10 Essential (primary) hypertension: Secondary | ICD-10-CM

## 2014-02-21 DIAGNOSIS — R0989 Other specified symptoms and signs involving the circulatory and respiratory systems: Secondary | ICD-10-CM

## 2014-02-21 LAB — BASIC METABOLIC PANEL
BUN: 16 mg/dL (ref 6–23)
CO2: 30 mEq/L (ref 19–32)
Calcium: 9.1 mg/dL (ref 8.4–10.5)
Chloride: 99 mEq/L (ref 96–112)
Creatinine, Ser: 0.8 mg/dL (ref 0.4–1.2)
GFR: 100.38 mL/min (ref >60.00)
Glucose, Bld: 111 mg/dL — ABNORMAL HIGH (ref 70–99)
Potassium: 3.2 mEq/L — ABNORMAL LOW (ref 3.5–5.1)
Sodium: 138 mEq/L (ref 135–145)

## 2014-02-21 LAB — BRAIN NATRIURETIC PEPTIDE: Pro B Natriuretic peptide (BNP): 36 pg/mL (ref 0.0–100.0)

## 2014-02-21 MED ORDER — CARVEDILOL 6.25 MG PO TABS: 9.7500 mg | ORAL_TABLET | Freq: Two times a day (BID) | ORAL | Status: DC

## 2014-02-21 NOTE — Patient Instructions (Addendum)
We will check lab today  Continue with your current medicines but I am increasing the Coreg to a pill and a half twice a day (9.75 mg) - this has been sent to your pharmacy.  Weigh yourself each morning and record.  Take extra dose of diuretic (LASIX) for weight gain of 3 pounds in 24 hours.   Limit sodium intake. Goal is to have less than 2000 mg (2gm) of salt per day.  Try to get a BP cuff - Omron is the brand to buy  I will see you in 3 weeks  Congrats for not smoking!!  Call the [place] office at [phone] if you have any questions, problems or concerns.

## 2014-02-21 NOTE — Progress Notes (Signed)
Minus Breeding Date of Birth: 1955/08/06 Medical Record #[id number]  History of Present Illness: Ms. Julia [name] is seen today for a post hospital/TOC visit. Seen for Dr. Debara Pickett. The patient is a 58 year old female with no past medical history by virtue of no past medical care.  Most recently presented to [facility] with a 3 day history of worsening SOB and DOE. Found to have LBBB, malignant HTN and systolic HF with EF at 24% with MR by echo. Cath showed normal coronaries. Diuresed almost 3 liters.   Comes in today. Here alone. She is doing ok. Breathing much better. No chest pain. No swelling Weight stable at home. Restricting salt. Her sister is helping her be compliant with her diet. Not checking her BP at home - no cuff - but can get. Brief dizziness if she gets up too fast. Overall, she is improved. She has stopped smoking.   Current Outpatient Prescriptions  Medication Sig Dispense Refill  . aspirin EC 81 MG EC tablet Take 1 tablet (81 mg total) by mouth daily.      . carvedilol (COREG) 6.25 MG tablet Take 1 tablet (6.25 mg total) by mouth 2 (two) times daily with a meal.  60 tablet  11  . furosemide (LASIX) 40 MG tablet Take 1 tablet (40 mg total) by mouth 2 (two) times daily.  60 tablet  11  . losartan (COZAAR) 50 MG tablet Take 1 tablet (50 mg total) by mouth daily.  30 tablet  11   No current facility-administered medications for this visit.    No Known Allergies  Past Medical History  Diagnosis Date  . Hypertension   . Systolic CHF     a. 2D ECHO: 02/09/2014; EF 35-30%, mild LV dilation, severe, diffuse hypokinesis, regional WMAs cannot be excluded. Ventricular septum with mild dyssynergy c/w LBBB, mild AR, severe MR, severely dilated LA.  . Mitral regurgitation     a. severe by ECHO  01/2014  . Tobacco abuse     Past Surgical History  Procedure Laterality Date  . 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 Abdominal hysterectomy      partial  . Tubal ligation    .  Cardiac catheterization  01/2014    Normal coronaries. EF of 35 to 40%    History  Smoking status  . Former Smoker -- 0.50 packs/day  . Types: Cigarettes  . Quit date: 02/15/2014  Smokeless tobacco  . Not on file    History  Alcohol Use No    History reviewed. No pertinent family history.  Review of Systems: The review of systems is per the HPI.  All other systems were reviewed and are negative.  Physical Exam: BP 160/100  Pulse 61  Ht 5' 6.5" (1.689 m)  Wt 194 lb 6.4 oz (88.179 kg)  BMI 30.91 kg/m2  SpO2 98% Patient is very pleasant and in no acute distress. She is obese. Skin is warm and dry. Color is normal.  HEENT is unremarkable. Normocephalic/atraumatic. PERRL. Sclera are nonicteric. Neck is supple. No masses. No JVD. Lungs are clear. Cardiac exam shows a regular rate and rhythm. +S4. Abdomen is soft. Extremities are without edema. Gait and ROM are intact. No gross neurologic deficits noted.  Wt Readings from Last 3 Encounters:  02/21/14 194 lb 6.4 oz (88.179 kg)  02/12/14 190 lb 3.2 oz (86.274 kg)  02/12/14 190 lb 3.2 oz (86.274 kg)    LABORATORY DATA/PROCEDURES: PENDING  Lab  Results  Component Value Date   WBC 6.3 02/10/2014   HGB 16.7* 02/10/2014   HCT 51.4* 02/10/2014   PLT 180 02/10/2014   GLUCOSE 106* 02/12/2014   CHOL 204* 02/10/2014   TRIG 121 02/10/2014   HDL 62 02/10/2014   LDLCALC 118* 02/10/2014   NA 137 02/12/2014   K 4.5 02/12/2014   CL 100 02/12/2014   CREATININE 0.72 02/12/2014   BUN 18 02/12/2014   CO2 22 02/12/2014   INR 1.04 02/10/2014   HGBA1C 5.8* 02/10/2014    BNP (last 3 results)  Recent Labs  02/08/14 1951 02/10/14 0424  PROBNP 2463.0* 767.3*   Echo Study Conclusions from August 2015  - Left ventricle: The cavity size was mildly dilated. There was moderate concentric hypertrophy. Systolic function was severely reduced. The estimated ejection fraction was in the range of 25% to 30%. Severe diffuse hypokinesis. Regional wall  motion abnormalities cannot be excluded. - Ventricular septum: Septal motion showed mild dyssynergy. These changes are consistent with a left bundle branch block. - Aortic valve: There was mild regurgitation. - Mitral valve: There was moderate to severe regurgitation. - Left atrium: The atrium was severely dilated.   PROCEDURE: 1. Left heart catheterization; 2. Coronary angiography; 3. Left ventriculography  CONSENT:  The risks, benefits, and details of the procedure were explained in detail to the patient. Risks including death, stroke, heart attack, kidney injury, allergy, limb ischemia, bleeding and radiation injury were discussed. The patient verbalized understanding and wanted to proceed. Informed written consent was obtained.  PROCEDURE TECHNIQUE: After Xylocaine anesthesia a 5 French Slender sheath was placed in the right radial artery with an angiocath and the modified Seldinger technique. Coronary angiography was done using a 5 F JL 3.5 and JR 4 diagnostic catheter. Left ventriculography was done using the JR 4 catheter and hand injection.  The digital images were reviewed and the case was terminated.  CONTRAST: Total of 90 cc.  COMPLICATIONS: None  HEMODYNAMICS: Aortic pressure 109/76 mmHg; LV pressure 111/5 mmHg; LVEDP 11 mm mercury  ANGIOGRAPHIC DATA: The left main coronary artery is normal.  The left anterior descending artery is widely patent. He gives origin to one large branching diagonal. The distal LAD is transapical..  The left circumflex artery gives origin to 2 obtuse marginal branches. The first is large and has the distribution of a ramus intermedius. There is a second very tiny obtuse marginal branch.  The right coronary artery is tortuous and dominant.Marland Kitchen  LEFT VENTRICULOGRAM: Left ventricular angiogram was done in the 30 RAO projection and revealed mildly dilated LV cavity with an ejection fraction estimated at 35-40%  IMPRESSIONS: 1. Normal coronary arteries with  marked tortuosity suggesting long-standing hypertension  2. Mild to moderate left ventricular systolic dysfunction with estimated EF of 35-40%. Normal left ventricular hemodynamic recordings.  RECOMMENDATION: Aggressive management of blood pressure with hopeful recovery of LV function over time..   Assessment / Plan: 1. Systolic HF - recent acute exacerbation - EF of 35% - most likely related to long standing HTN. Coreg increased today. Explained the need to titrate her medicines up as tolerated. Repeat echo in 3 months. Avoid salt. Daily weights. See back in 3 weeks. Ok to start walking 5 to 10 minutes a day.   2. Malignant HTN -  BP 140/100 by me. Coreg increased today as well.   3. LBBB  4. S/P recent cardiac cath - normal coronaries.   5. Tobacco abuse - not smoking.   Check labs today.  See in 3 weeks.   Patient is agreeable to this plan and will call if any problems develop in the interim.   Burtis Junes, RN, [facility] [address] [place], [place]  [phone]

## 2014-03-01 ENCOUNTER — Other Ambulatory Visit: Payer: Self-pay | Admitting: *Deleted

## 2014-03-01 DIAGNOSIS — E876 Hypokalemia: Secondary | ICD-10-CM

## 2014-03-01 MED ORDER — POTASSIUM CHLORIDE CRYS ER 20 MEQ PO TBCR: 20.0000 meq | EXTENDED_RELEASE_TABLET | Freq: Every day | ORAL | Status: DC

## 2014-03-09 ENCOUNTER — Encounter: Payer: Self-pay | Admitting: Nurse Practitioner

## 2014-03-09 ENCOUNTER — Ambulatory Visit (INDEPENDENT_AMBULATORY_CARE_PROVIDER_SITE_OTHER): Payer: Medicaid Other | Admitting: Nurse Practitioner

## 2014-03-09 VITALS — BP 140/90 | HR 71 | Ht 66.5 in | Wt 197.8 lb

## 2014-03-09 DIAGNOSIS — I5022 Chronic systolic (congestive) heart failure: Secondary | ICD-10-CM

## 2014-03-09 DIAGNOSIS — E876 Hypokalemia: Secondary | ICD-10-CM

## 2014-03-09 DIAGNOSIS — I10 Essential (primary) hypertension: Secondary | ICD-10-CM

## 2014-03-09 LAB — BASIC METABOLIC PANEL
BUN: 19 mg/dL (ref 6–23)
CO2: 27 mEq/L (ref 19–32)
Calcium: 9.3 mg/dL (ref 8.4–10.5)
Chloride: 104 mEq/L (ref 96–112)
Creatinine, Ser: 0.8 mg/dL (ref 0.4–1.2)
GFR: 94.59 mL/min (ref >60.00)
Glucose, Bld: 99 mg/dL (ref 70–99)
Potassium: 3.4 mEq/L — ABNORMAL LOW (ref 3.5–5.1)
Sodium: 141 mEq/L (ref 135–145)

## 2014-03-09 LAB — BRAIN NATRIURETIC PEPTIDE: Pro B Natriuretic peptide (BNP): 26 pg/mL (ref 0.0–100.0)

## 2014-03-09 MED ORDER — FUROSEMIDE 40 MG PO TABS: 40.0000 mg | ORAL_TABLET | Freq: Two times a day (BID) | ORAL | Status: DC

## 2014-03-09 MED ORDER — LOSARTAN POTASSIUM 50 MG PO TABS: 50.0000 mg | ORAL_TABLET | Freq: Every day | ORAL | Status: DC

## 2014-03-09 MED ORDER — SPIRONOLACTONE 25 MG PO TABS: 12.5000 mg | ORAL_TABLET | Freq: Every day | ORAL | Status: DC

## 2014-03-09 MED ORDER — POTASSIUM CHLORIDE CRYS ER 20 MEQ PO TBCR: 20.0000 meq | EXTENDED_RELEASE_TABLET | Freq: Every day | ORAL | Status: DC

## 2014-03-09 NOTE — Patient Instructions (Addendum)
Stay on your current medicines but I am adding Aldactone 25 mg - take 1/2 a pill each morning  We will check lab today  We will need to check lab in a week - I hope to stop your potassium at that time  Continue to restrict your salt  I will see you in 2 weeks  Call the [place] office at [phone] if you have any questions, problems or concerns.

## 2014-03-09 NOTE — Progress Notes (Signed)
Minus Breeding Date of Birth: 1955-07-15 Medical Record #[id number]  History of Present Illness: Ms. Julia [name] is seen today for a 2 week visit. Seen for Dr. Debara Pickett. The patient is a 58 year old female with no past medical history by virtue of no past medical care.   Most recently presented to [facility] with a 3 day history of worsening SOB and DOE. Found to have LBBB, malignant HTN and systolic HF with EF at 78% with MR by echo. Cath showed normal coronaries. Diuresed almost 3 liters.   I saw her 2 weeks ago - she was much improved. Had stopped smoking. Coreg increased. Trying to titrate medicines up.  Comes in today. Here alone. She is doing pretty good. Not short of breath. No swelling. No smoking. No chest pain. She is eating more since she is not smoking. Not dizzy or lightheaded. Feels pretty good. Using 2 separate scales - hers at home which is not a digital scale and then her boyfriend's on the weekend - which is a digital.   Current Outpatient Prescriptions  Medication Sig Dispense Refill  . aspirin EC 81 MG EC tablet Take 1 tablet (81 mg total) by mouth daily.      . carvedilol (COREG) 6.25 MG tablet Take 1.5 tablets (9.375 mg total) by mouth 2 (two) times daily with a meal.  90 tablet  11  . furosemide (LASIX) 40 MG tablet Take 1 tablet (40 mg total) by mouth 2 (two) times daily.  60 tablet  11  . losartan (COZAAR) 50 MG tablet Take 1 tablet (50 mg total) by mouth daily.  30 tablet  11  . potassium chloride SA (K-DUR,KLOR-CON) 20 MEQ tablet Take 1 tablet (20 mEq total) by mouth daily.  30 tablet  3   No current facility-administered medications for this visit.    No Known Allergies  Past Medical History  Diagnosis Date  . Hypertension   . Systolic CHF     a. 2D ECHO: 02/09/2014; EF 35-30%, mild LV dilation, severe, diffuse hypokinesis, regional WMAs cannot be excluded. Ventricular septum with mild dyssynergy c/w LBBB, mild AR, severe MR, severely dilated LA.  . Mitral regurgitation      a. severe by ECHO  01/2014  . Tobacco abuse     Past Surgical History  Procedure Laterality Date  . 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 Abdominal hysterectomy      partial  . Tubal ligation    . Cardiac catheterization  01/2014    Normal coronaries. EF of 35 to 40%    History  Smoking status  . Former Smoker -- 0.50 packs/day  . Types: Cigarettes  . Quit date: 02/15/2014  Smokeless tobacco  . Not on file    History  Alcohol Use No    History reviewed. No pertinent family history.  Review of Systems: The review of systems is per the HPI.  All other systems were reviewed and are negative.  Physical Exam: BP 140/90  Pulse 71  Ht 5' 6.5" (1.689 m)  Wt 197 lb 12.8 oz (89.721 kg)  BMI 31.45 kg/m2  SpO2 97% Patient is very pleasant and in no acute distress. Skin is warm and dry. Color is normal.  HEENT is unremarkable. Normocephalic/atraumatic. PERRL. Sclera are nonicteric. Neck is supple. No masses. No JVD. Lungs are clear. Cardiac exam shows a regular rate and rhythm. Soft S3 noted. Abdomen is soft. Extremities are without edema. Gait and ROM  are intact. No gross neurologic deficits noted.  Wt Readings from Last 3 Encounters:  03/09/14 197 lb 12.8 oz (89.721 kg)  02/21/14 194 lb 6.4 oz (88.179 kg)  02/12/14 190 lb 3.2 oz (86.274 kg)    LABORATORY DATA/PROCEDURES:  Lab Results  Component Value Date   WBC 6.3 02/10/2014   HGB 16.7* 02/10/2014   HCT 51.4* 02/10/2014   PLT 180 02/10/2014   GLUCOSE 111* 02/21/2014   CHOL 204* 02/10/2014   TRIG 121 02/10/2014   HDL 62 02/10/2014   LDLCALC 118* 02/10/2014   NA 138 02/21/2014   K 3.2* 02/21/2014   CL 99 02/21/2014   CREATININE 0.8 02/21/2014   BUN 16 02/21/2014   CO2 30 02/21/2014   INR 1.04 02/10/2014   HGBA1C 5.8* 02/10/2014    BNP (last 3 results)  Recent Labs  02/08/14 1951 02/10/14 0424 02/21/14 1133  PROBNP 2463.0* 767.3* 36.0   Echo Study Conclusions from August 2015  - Left ventricle:  The cavity size was mildly dilated. There was moderate concentric hypertrophy. Systolic function was severely reduced. The estimated ejection fraction was in the range of 25% to 30%. Severe diffuse hypokinesis. Regional wall motion abnormalities cannot be excluded. - Ventricular septum: Septal motion showed mild dyssynergy. These changes are consistent with a left bundle branch block. - Aortic valve: There was mild regurgitation. - Mitral valve: There was moderate to severe regurgitation. - Left atrium: The atrium was severely dilated.   PROCEDURE: 1. Left heart catheterization; 2. Coronary angiography; 3. Left ventriculography  CONSENT:  The risks, benefits, and details of the procedure were explained in detail to the patient. Risks including death, stroke, heart attack, kidney injury, allergy, limb ischemia, bleeding and radiation injury were discussed. The patient verbalized understanding and wanted to proceed. Informed written consent was obtained.  PROCEDURE TECHNIQUE: After Xylocaine anesthesia a 5 French Slender sheath was placed in the right radial artery with an angiocath and the modified Seldinger technique. Coronary angiography was done using a 5 F JL 3.5 and JR 4 diagnostic catheter. Left ventriculography was done using the JR 4 catheter and hand injection.  The digital images were reviewed and the case was terminated.  CONTRAST: Total of 90 cc.  COMPLICATIONS: None  HEMODYNAMICS: Aortic pressure 109/76 mmHg; LV pressure 111/5 mmHg; LVEDP 11 mm mercury  ANGIOGRAPHIC DATA: The left main coronary artery is normal.  The left anterior descending artery is widely patent. He gives origin to one large branching diagonal. The distal LAD is transapical..  The left circumflex artery gives origin to 2 obtuse marginal branches. The first is large and has the distribution of a ramus intermedius. There is a second very tiny obtuse marginal branch.  The right coronary artery is tortuous and  dominant.Marland Kitchen  LEFT VENTRICULOGRAM: Left ventricular angiogram was done in the 30 RAO projection and revealed mildly dilated LV cavity with an ejection fraction estimated at 35-40%  IMPRESSIONS: 1. Normal coronary arteries with marked tortuosity suggesting long-standing hypertension  2. Mild to moderate left ventricular systolic dysfunction with estimated EF of 35-40%. Normal left ventricular hemodynamic recordings.  RECOMMENDATION: Aggressive management of blood pressure with hopeful recovery of LV function over time.    Assessment / Plan:  1. Systolic HF - recent acute exacerbation - EF of 35% - most likely related to long standing HTN. Adding Aldactone today at 12.5 mg. Check lab today and BMET in one week.   Repeat echo in 3 months (after November 13th). Avoid salt. Daily weights.  2. Malignant HTN - Aldactone added today.   3. LBBB   4. S/P recent cardiac cath - normal coronaries.   5. Tobacco abuse - not smoking.   6. Hypokalemia - needs repeat BMET today - hope to stop once we get her on Aldactone.   See back in about 2 weeks.   Patient is agreeable to this plan and will call if any problems develop in the interim.   Burtis Junes, RN, [facility] [address] [place], [place]  [phone]

## 2014-03-16 ENCOUNTER — Other Ambulatory Visit (INDEPENDENT_AMBULATORY_CARE_PROVIDER_SITE_OTHER): Payer: Medicaid Other

## 2014-03-16 DIAGNOSIS — I5022 Chronic systolic (congestive) heart failure: Secondary | ICD-10-CM

## 2014-03-16 DIAGNOSIS — E876 Hypokalemia: Secondary | ICD-10-CM

## 2014-03-16 DIAGNOSIS — I10 Essential (primary) hypertension: Secondary | ICD-10-CM

## 2014-03-16 LAB — BASIC METABOLIC PANEL
BUN: 11 mg/dL (ref 6–23)
CO2: 29 mEq/L (ref 19–32)
Calcium: 9.2 mg/dL (ref 8.4–10.5)
Chloride: 102 mEq/L (ref 96–112)
Creatinine, Ser: 0.8 mg/dL (ref 0.4–1.2)
GFR: 97.39 mL/min (ref >60.00)
Glucose, Bld: 136 mg/dL — ABNORMAL HIGH (ref 70–99)
Potassium: 3.5 mEq/L (ref 3.5–5.1)
Sodium: 139 mEq/L (ref 135–145)

## 2014-03-28 ENCOUNTER — Other Ambulatory Visit: Payer: Self-pay

## 2014-03-28 ENCOUNTER — Encounter: Payer: Self-pay | Admitting: Nurse Practitioner

## 2014-03-28 ENCOUNTER — Ambulatory Visit (INDEPENDENT_AMBULATORY_CARE_PROVIDER_SITE_OTHER): Payer: Medicaid Other | Admitting: Nurse Practitioner

## 2014-03-28 VITALS — BP 120/86 | HR 67 | Ht 66.5 in | Wt 198.1 lb

## 2014-03-28 DIAGNOSIS — I5022 Chronic systolic (congestive) heart failure: Secondary | ICD-10-CM

## 2014-03-28 DIAGNOSIS — E876 Hypokalemia: Secondary | ICD-10-CM

## 2014-03-28 DIAGNOSIS — I10 Essential (primary) hypertension: Secondary | ICD-10-CM

## 2014-03-28 LAB — BASIC METABOLIC PANEL
BUN: 15 mg/dL (ref 6–23)
CO2: 26 mEq/L (ref 19–32)
Calcium: 9.4 mg/dL (ref 8.4–10.5)
Chloride: 104 mEq/L (ref 96–112)
Creatinine, Ser: 0.8 mg/dL (ref 0.4–1.2)
GFR: 97.38 mL/min (ref >60.00)
Glucose, Bld: 102 mg/dL — ABNORMAL HIGH (ref 70–99)
Potassium: 3.8 mEq/L (ref 3.5–5.1)
Sodium: 138 mEq/L (ref 135–145)

## 2014-03-28 MED ORDER — SPIRONOLACTONE 25 MG PO TABS: 25.0000 mg | ORAL_TABLET | Freq: Every day | ORAL | Status: DC

## 2014-03-28 MED ORDER — FUROSEMIDE 40 MG PO TABS: 40.0000 mg | ORAL_TABLET | Freq: Every day | ORAL | Status: DC

## 2014-03-28 NOTE — Patient Instructions (Addendum)
We will be checking the following labs today BMET  Stay on your current medicines but increase the Aldactone to a whole tablet (25mg ) each day.   Cut your Lasix back to just one a day  Take extra dose of diuretic (Lasix) for weight gain of 3 pounds in 24 hours.   Echo for after November 13th  Follow up with Dr. Debara Pickett a few days later at the [facility] office  Continue to restrict salt and weigh daily  Call the [place] office at [phone] if you have any questions, problems or concerns.

## 2014-03-28 NOTE — Progress Notes (Signed)
Minus Breeding Date of Birth: 1955-09-19 Medical Record #[id number]  History of Present Illness: Julia [name] is seen today for a 3 week visit. Seen for Dr. Debara Pickett. The patient is a 58 year old female with no past medical history by virtue of no past medical care.   Most recently presented to [facility] with a 3 day history of worsening SOB and DOE. Found to have LBBB, malignant HTN and systolic HF with EF at 71% with MR by echo. Cath showed normal coronaries. Diuresed almost 3 liters.   I have seen her a couple of times since her discharge - she has improved clinically. We have tried to titrate medicines up.   Comes in today. Here alone. She is doing pretty good. Weight is stable. Not short of breath. No swelling. Little gassy but attributes this to eating some potato salad this weekend. Not dizzy or lightheaded. BP better at home. Not smoking.   Current Outpatient Prescriptions  Medication Sig Dispense Refill  . aspirin EC 81 MG EC tablet Take 1 tablet (81 mg total) by mouth daily.      . carvedilol (COREG) 6.25 MG tablet Take 1.5 tablets (9.375 mg total) by mouth 2 (two) times daily with a meal.  90 tablet  11  . furosemide (LASIX) 40 MG tablet Take 1 tablet (40 mg total) by mouth 2 (two) times daily.  60 tablet  11  . losartan (COZAAR) 50 MG tablet Take 1 tablet (50 mg total) by mouth daily.  30 tablet  11  . potassium chloride SA (K-DUR,KLOR-CON) 20 MEQ tablet Take 1 tablet (20 mEq total) by mouth daily.  30 tablet  3  . spironolactone (ALDACTONE) 25 MG tablet Take 0.5 tablets (12.5 mg total) by mouth daily.  30 tablet  3   No current facility-administered medications for this visit.    No Known Allergies  Past Medical History  Diagnosis Date  . Hypertension   . Systolic CHF     a. 2D ECHO: 02/09/2014; EF 35-30%, mild LV dilation, severe, diffuse hypokinesis, regional WMAs cannot be excluded. Ventricular septum with mild dyssynergy c/w LBBB, mild AR, severe MR, severely dilated LA.  .  Mitral regurgitation     a. severe by ECHO  01/2014  . Tobacco abuse     Past Surgical History  Procedure Laterality Date  . 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 Abdominal hysterectomy      partial  . Tubal ligation    . Cardiac catheterization  01/2014    Normal coronaries. EF of 35 to 40%    History  Smoking status  . Former Smoker -- 0.50 packs/day  . Types: Cigarettes  . Quit date: 02/15/2014  Smokeless tobacco  . Not on file    History  Alcohol Use No    No family history on file.  Review of Systems: The review of systems is per the HPI.  All other systems were reviewed and are negative.  Physical Exam: BP 120/86  Pulse 67  Ht 5' 6.5" (1.689 m)  Wt 198 lb 1.9 oz (89.867 kg)  BMI 31.50 kg/m2  SpO2 100% Patient is very pleasant and in no acute distress. Skin is warm and dry. Color is normal.  HEENT is unremarkable. Normocephalic/atraumatic. PERRL. Sclera are nonicteric. Neck is supple. No masses. No JVD. Lungs are clear. Cardiac exam shows a regular rate and rhythm. Soft S3. Abdomen is soft. Extremities are without edema. Gait and ROM  are intact. No gross neurologic deficits noted.  Wt Readings from Last 3 Encounters:  03/28/14 198 lb 1.9 oz (89.867 kg)  03/09/14 197 lb 12.8 oz (89.721 kg)  02/21/14 194 lb 6.4 oz (88.179 kg)    LABORATORY DATA/PROCEDURES: BMET pending  Lab Results  Component Value Date   WBC 6.3 02/10/2014   HGB 16.7* 02/10/2014   HCT 51.4* 02/10/2014   PLT 180 02/10/2014   GLUCOSE 136* 03/16/2014   CHOL 204* 02/10/2014   TRIG 121 02/10/2014   HDL 62 02/10/2014   LDLCALC 118* 02/10/2014   NA 139 03/16/2014   K 3.5 03/16/2014   CL 102 03/16/2014   CREATININE 0.8 03/16/2014   BUN 11 03/16/2014   CO2 29 03/16/2014   INR 1.04 02/10/2014   HGBA1C 5.8* 02/10/2014    BNP (last 3 results)  Recent Labs  02/10/14 0424 02/21/14 1133 03/09/14 0903  PROBNP 767.3* 36.0 26.0    Echo Study Conclusions from August 2015  - Left  ventricle: The cavity size was mildly dilated. There was moderate concentric hypertrophy. Systolic function was severely reduced. The estimated ejection fraction was in the range of 25% to 30%. Severe diffuse hypokinesis. Regional wall motion abnormalities cannot be excluded. - Ventricular septum: Septal motion showed mild dyssynergy. These changes are consistent with a left bundle branch block. - Aortic valve: There was mild regurgitation. - Mitral valve: There was moderate to severe regurgitation. - Left atrium: The atrium was severely dilated.   PROCEDURE: 1. Left heart catheterization; 2. Coronary angiography; 3. Left ventriculography  CONSENT:  The risks, benefits, and details of the procedure were explained in detail to the patient. Risks including death, stroke, heart attack, kidney injury, allergy, limb ischemia, bleeding and radiation injury were discussed. The patient verbalized understanding and wanted to proceed. Informed written consent was obtained.  PROCEDURE TECHNIQUE: After Xylocaine anesthesia a 5 French Slender sheath was placed in the right radial artery with an angiocath and the modified Seldinger technique. Coronary angiography was done using a 5 F JL 3.5 and JR 4 diagnostic catheter. Left ventriculography was done using the JR 4 catheter and hand injection.  The digital images were reviewed and the case was terminated.  CONTRAST: Total of 90 cc.  COMPLICATIONS: None  HEMODYNAMICS: Aortic pressure 109/76 mmHg; LV pressure 111/5 mmHg; LVEDP 11 mm mercury  ANGIOGRAPHIC DATA: The left main coronary artery is normal.  The left anterior descending artery is widely patent. He gives origin to one large branching diagonal. The distal LAD is transapical..  The left circumflex artery gives origin to 2 obtuse marginal branches. The first is large and has the distribution of a ramus intermedius. There is a second very tiny obtuse marginal branch.  The right coronary artery is  tortuous and dominant.Marland Kitchen  LEFT VENTRICULOGRAM: Left ventricular angiogram was done in the 30 RAO projection and revealed mildly dilated LV cavity with an ejection fraction estimated at 35-40%  IMPRESSIONS: 1. Normal coronary arteries with marked tortuosity suggesting long-standing hypertension  2. Mild to moderate left ventricular systolic dysfunction with estimated EF of 35-40%. Normal left ventricular hemodynamic recordings.  RECOMMENDATION: Aggressive management of blood pressure with hopeful recovery of LV function over time.     Assessment / Plan:  1. Systolic HF - recent acute exacerbation - EF of 35% - most likely related to long standing HTN. Repeat echo in 3 months (after November 13th). Continue to avoid salt. Daily weights, etc. I have increased the Aldactone to 25 mg a day.  Cut Lasix back to just 40 mg a day. Check BMET today. Follow up with Dr. Debara Pickett after echo.   2. Malignant HTN - BP by me is 132/90.   3. LBBB   4. S/P recent cardiac cath - normal coronaries.   5. Tobacco abuse - not smoking.   Recheck BMET today. Arrange echo for after November 13th with follow up with Dr. Debara Pickett. Med changes as above.  Patient is agreeable to this plan and will call if any problems develop in the interim.   Burtis Junes, RN, [facility] [address] [place], [place]  [phone]

## 2014-05-15 ENCOUNTER — Ambulatory Visit (HOSPITAL_COMMUNITY): Payer: Medicaid Other | Attending: Nurse Practitioner | Admitting: Cardiology

## 2014-05-15 DIAGNOSIS — Z720 Tobacco use: Secondary | ICD-10-CM | POA: Diagnosis not present

## 2014-05-15 DIAGNOSIS — E876 Hypokalemia: Secondary | ICD-10-CM

## 2014-05-15 DIAGNOSIS — I10 Essential (primary) hypertension: Secondary | ICD-10-CM | POA: Diagnosis not present

## 2014-05-15 DIAGNOSIS — I5022 Chronic systolic (congestive) heart failure: Secondary | ICD-10-CM | POA: Diagnosis not present

## 2014-05-15 NOTE — Progress Notes (Signed)
Echo performed. 

## 2014-05-19 ENCOUNTER — Ambulatory Visit: Payer: Self-pay | Admitting: Internal Medicine

## 2014-05-22 ENCOUNTER — Ambulatory Visit: Payer: Self-pay | Admitting: Internal Medicine

## 2014-06-08 ENCOUNTER — Encounter (HOSPITAL_COMMUNITY): Payer: Self-pay | Admitting: Interventional Cardiology

## 2014-06-16 ENCOUNTER — Encounter: Payer: Self-pay | Admitting: Internal Medicine

## 2014-06-16 ENCOUNTER — Ambulatory Visit (INDEPENDENT_AMBULATORY_CARE_PROVIDER_SITE_OTHER): Payer: Medicaid Other | Admitting: Internal Medicine

## 2014-06-16 VITALS — BP 137/86 | HR 81 | Ht 66.5 in | Wt 211.1 lb

## 2014-06-16 DIAGNOSIS — I340 Nonrheumatic mitral (valve) insufficiency: Secondary | ICD-10-CM

## 2014-06-16 DIAGNOSIS — I10 Essential (primary) hypertension: Secondary | ICD-10-CM

## 2014-06-16 DIAGNOSIS — I420 Dilated cardiomyopathy: Secondary | ICD-10-CM

## 2014-06-16 DIAGNOSIS — I447 Left bundle-branch block, unspecified: Secondary | ICD-10-CM

## 2014-06-16 NOTE — Patient Instructions (Signed)
Your physician wants you to follow-up in: 1 year with Dr. Hilty. You will receive a reminder letter in the mail two months in advance. If you don't receive a letter, please call our office to schedule the follow-up appointment.  

## 2014-06-16 NOTE — Progress Notes (Signed)
OFFICE NOTE  Chief Complaint:  No complaints  Primary Care Physician: No PCP Per Patient  HPI:  Julia [name] is a 58 yo female who was recently admitted to [facility] with a 3 day history of worsening SOB and DOE. Found to have LBBB, malignant HTN and systolic HF with EF at 40% with MR by echo. She underwent cardiac catheterization which showed normal coronaries. Diuresed almost 3 liters. Her blood pressure was better controlled at discharge and she was seen in follow-up by Truitt Merle, NP, who further up titrated her beta blocker. She's also had a reduction in her Lasix. A repeat echo was performed in November which shows an EF has improved up to 50%. She has successfully stopped smoking for the past 6 months. She has had some weight gain was worked on exercise and portion changes. Her energy level has improved and generally feels much better.  PMHx:  Past Medical History  Diagnosis Date  . Hypertension   . Systolic CHF     a. 2D ECHO: 02/09/2014; EF 35-30%, mild LV dilation, severe, diffuse hypokinesis, regional WMAs cannot be excluded. Ventricular septum with mild dyssynergy c/w LBBB, mild AR, severe MR, severely dilated LA.  . Mitral regurgitation     a. severe by ECHO  01/2014  . Tobacco abuse     Past Surgical History  Procedure Laterality Date  . 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 Abdominal hysterectomy      partial  . Tubal ligation    . Cardiac catheterization  01/2014    Normal coronaries. EF of 35 to 40%  . Left heart catheterization with coronary angiogram N/A 02/10/2014    Procedure: LEFT HEART CATHETERIZATION WITH CORONARY ANGIOGRAM;  Surgeon: Sinclair Grooms, MD;  Location: [facility] CATH LAB;  Service: Cardiovascular;  Laterality: N/A;    FAMHx:  History reviewed. No pertinent family history.  SOCHx:   reports that she quit smoking about 3 months ago. Her smoking use included Cigarettes. She smoked 0.50 packs per day. She does not have any  smokeless tobacco history on file. She reports that she does not drink alcohol or use illicit drugs.  ALLERGIES:  No Known Allergies  ROS: A comprehensive review of systems was negative.  HOME MEDS: Current Outpatient Prescriptions  Medication Sig Dispense Refill  . aspirin EC 81 MG EC tablet Take 1 tablet (81 mg total) by mouth daily.    . carvedilol (COREG) 6.25 MG tablet Take 1.5 tablets (9.375 mg total) by mouth 2 (two) times daily with a meal. 90 tablet 11  . furosemide (LASIX) 40 MG tablet Take 1 tablet (40 mg total) by mouth daily. 60 tablet 11  . losartan (COZAAR) 50 MG tablet Take 1 tablet (50 mg total) by mouth daily. 30 tablet 11  . spironolactone (ALDACTONE) 25 MG tablet Take 1 tablet (25 mg total) by mouth daily. 30 tablet 6   No current facility-administered medications for this visit.    LABS/IMAGING: No results found for this or any previous visit (from the past 48 hour(s)). No results found.  VITALS: BP 137/86 mmHg  Pulse 81  Ht 5' 6.5" (1.689 m)  Wt 211 lb 1.6 oz (95.754 kg)  BMI 33.57 kg/m2  EXAM: General appearance: alert and no distress Neck: no carotid bruit and no JVD Lungs: clear to auscultation bilaterally Heart: regular rate and rhythm, S1, S2 normal, no murmur, click, rub or gallop Abdomen: soft, non-tender; bowel sounds  normal; no masses,  no organomegaly Extremities: extremities normal, atraumatic, no cyanosis or edema Pulses: 2+ and symmetric Skin: Skin color, texture, turgor normal. No rashes or lesions Neurologic: Grossly normal Psych: Normal  EKG: Deferred  ASSESSMENT: 1. Left bundle branch block 2. Acute systolic congestive heart failure-EF was 35%, now 50% 3. Malignant hypertension-controlled  PLAN: 1.   Mrs. Julia [name] has had big improvement in blood pressure control. Her EF is now normalized. She is no longer short of breath. Her coronary arteries were normal at cath. She should continue her current medications for heart failure.  She may ultimately be able to stop her Lasix. I would plan to keep her on Aldactone as it may be helping with her blood pressure. Plan to see her back annually or sooner as necessary.  Pixie Casino, MD, [facility] Attending Cardiologist [facility]  HILTY,Kenneth C 06/16/2014, 10:53 AM

## 2014-06-26 ENCOUNTER — Telehealth: Payer: Self-pay | Admitting: *Deleted

## 2014-06-26 NOTE — Telephone Encounter (Signed)
Faxed Prior Authorization for losartan 50mg  daily to [facility]

## 2014-07-18 ENCOUNTER — Telehealth: Payer: Self-pay | Admitting: *Deleted

## 2014-07-18 NOTE — Telephone Encounter (Signed)
Called pharmacy to inquire about PA status of Losartan. They state it is approved.

## 2014-12-09 ENCOUNTER — Other Ambulatory Visit: Payer: Self-pay | Admitting: Nurse Practitioner

## 2015-02-02 ENCOUNTER — Ambulatory Visit (INDEPENDENT_AMBULATORY_CARE_PROVIDER_SITE_OTHER): Payer: Medicaid Other | Admitting: Family Medicine

## 2015-02-02 ENCOUNTER — Encounter: Payer: Self-pay | Admitting: Family Medicine

## 2015-02-02 VITALS — BP 130/96 | HR 65 | Temp 98.4°F | Resp 18 | Ht 66.5 in | Wt 220.0 lb

## 2015-02-02 DIAGNOSIS — E876 Hypokalemia: Secondary | ICD-10-CM | POA: Diagnosis not present

## 2015-02-02 DIAGNOSIS — I5022 Chronic systolic (congestive) heart failure: Secondary | ICD-10-CM | POA: Diagnosis not present

## 2015-02-02 DIAGNOSIS — I10 Essential (primary) hypertension: Secondary | ICD-10-CM | POA: Diagnosis not present

## 2015-02-02 LAB — CBC WITH DIFFERENTIAL/PLATELET
Basophils Absolute: 0 10*3/uL (ref 0.0–0.1)
Basophils Relative: 0 % (ref 0–1)
Eosinophils Absolute: 0.1 10*3/uL (ref 0.0–0.7)
Eosinophils Relative: 1 % (ref 0–5)
HEMATOCRIT: 40.7 % (ref 36.0–46.0)
HEMOGLOBIN: 13.8 g/dL (ref 12.0–15.0)
Lymphocytes Relative: 37 % (ref 12–46)
Lymphs Abs: 2.2 10*3/uL (ref 0.7–4.0)
MCH: 30.6 pg (ref 26.0–34.0)
MCHC: 33.9 g/dL (ref 30.0–36.0)
MCV: 90.2 fL (ref 78.0–100.0)
MONOS PCT: 6 % (ref 3–12)
MPV: 10.5 fL (ref 8.6–12.4)
Monocytes Absolute: 0.4 10*3/uL (ref 0.1–1.0)
NEUTROS ABS: 3.3 10*3/uL (ref 1.7–7.7)
NEUTROS PCT: 56 % (ref 43–77)
Platelets: 201 10*3/uL (ref 150–400)
RBC: 4.51 MIL/uL (ref 3.87–5.11)
RDW: 14.5 % (ref 11.5–15.5)
WBC: 5.9 10*3/uL (ref 4.0–10.5)

## 2015-02-02 LAB — COMPLETE METABOLIC PANEL WITH GFR
ALT: 15 U/L (ref 6–29)
AST: 20 U/L (ref 10–35)
Albumin: 4 g/dL (ref 3.6–5.1)
Alkaline Phosphatase: 62 U/L (ref 33–130)
BILIRUBIN TOTAL: 0.4 mg/dL (ref 0.2–1.2)
BUN: 12 mg/dL (ref 7–25)
CHLORIDE: 105 mmol/L (ref 98–110)
CO2: 21 mmol/L (ref 20–31)
CREATININE: 0.82 mg/dL (ref 0.50–1.05)
Calcium: 9.5 mg/dL (ref 8.6–10.4)
GFR, Est African American: >89 mL/min (ref >=60)
GFR, Est Non African American: 79 mL/min (ref >=60)
Glucose, Bld: 91 mg/dL (ref 65–99)
Potassium: 4.5 mmol/L (ref 3.5–5.3)
Sodium: 141 mmol/L (ref 135–146)
Total Protein: 7 g/dL (ref 6.1–8.1)

## 2015-02-02 LAB — LIPID PANEL
CHOL/HDL RATIO: 3.9 ratio (ref <=5.0)
Cholesterol: 195 mg/dL (ref 125–200)
HDL: 50 mg/dL (ref >=46)
LDL Cholesterol: 109 mg/dL (ref <130)
TRIGLYCERIDES: 181 mg/dL — AB (ref <150)
VLDL: 36 mg/dL — AB (ref <30)

## 2015-02-02 LAB — TSH: TSH: 1.991 u[IU]/mL (ref 0.350–4.500)

## 2015-02-02 MED ORDER — FUROSEMIDE 40 MG PO TABS: 40.0000 mg | ORAL_TABLET | Freq: Every day | ORAL | Status: DC

## 2015-02-02 MED ORDER — CARVEDILOL 6.25 MG PO TABS: 9.7500 mg | ORAL_TABLET | Freq: Two times a day (BID) | ORAL | Status: DC

## 2015-02-02 MED ORDER — SPIRONOLACTONE 25 MG PO TABS: 25.0000 mg | ORAL_TABLET | Freq: Every day | ORAL | Status: DC

## 2015-02-02 MED ORDER — LOSARTAN POTASSIUM 50 MG PO TABS: 50.0000 mg | ORAL_TABLET | Freq: Every day | ORAL | Status: DC

## 2015-02-02 NOTE — Progress Notes (Signed)
Patient ID: Julia [name], female   DOB: 19-Feb-1956, 59 y.o.   MRN: [id number]   Julia [name], is a 59 y.o. female  HKV:[id number]  FIE:[id number]  DOB - 04-25-1956  CC:  Chief Complaint  Patient presents with  . establish patient       HPI: Julia [name] is a 59 y.o. female here to establish care. He has not had a primary provider in a number of years. She does have CHF and has been followed by cardiology but they have recommended that she can be followed by primary care.  She has a history of hypertension. She has little problem with her CHF with her current treatment. She also has mitral regurgitation. She has a history of tobacco abuse, but stopped almost a year ago. She does not use alcohol or illicit drugs. She does have a history of GERD. For her hypertension and CFH she is on Coreg 6.25 bid, lasix 40 daily, Cozaar 50 daily, Aldactone 25 daily. She takes ASA 81 daily. She is woefully deficient in her health maintenance issues. She needs tetanus, pneumnonia, hiv, hep c, mammogram. She has had a partial hysterectomy and does not need a pap. She does need a colonoscopy. No Known Allergies Past Medical History  Diagnosis Date  . Hypertension   . Systolic CHF     a. 2D ECHO: 02/09/2014; EF 35-30%, mild LV dilation, severe, diffuse hypokinesis, regional WMAs cannot be excluded. Ventricular septum with mild dyssynergy c/w LBBB, mild AR, severe MR, severely dilated LA.  . Mitral regurgitation     a. severe by ECHO  01/2014  . Tobacco abuse   . Allergy   . Anxiety   . CHF (congestive heart failure)   . GERD (gastroesophageal reflux disease)    Current Outpatient Prescriptions on File Prior to Visit  Medication Sig Dispense Refill  . aspirin EC 81 MG EC tablet Take 1 tablet (81 mg total) by mouth daily. (Patient not taking: Reported on 02/02/2015)     No current facility-administered medications on file prior to visit.   Family History  Problem Relation Age of Onset  . Diabetes  Mother   . Hypertension Mother   . Diabetes Father   . Heart disease Father   . Diabetes Sister   . Drug abuse Sister   . Hypertension Sister   . Mental illness Sister   . Stroke Sister   . Miscarriages / Stillbirths Sister   . Alcohol abuse Brother   . Cancer Brother   . Alcohol abuse Maternal Aunt   . Alcohol abuse Maternal Uncle   . Alcohol abuse Paternal Uncle   . Cancer Paternal Uncle    History   Social History  . Marital Status: Single    Spouse Name: N/A  . Number of Children: N/A  . Years of Education: N/A   Occupational History  . Not on file.   Social History Main Topics  . Smoking status: Former Smoker -- 0.50 packs/day    Types: Cigarettes    Quit date: 02/15/2014  . Smokeless tobacco: Not on file  . Alcohol Use: No  . Drug Use: No  . Sexual Activity: Yes   Other Topics Concern  . Not on file   Social History Narrative    Review of Systems: Constitutional: Negative for fever, chills, appetite change, weight loss,  Fatigue. Skin: Negative for rashes or lesions of concern. HENT: Negative for ear pain, ear discharge.nose bleeds Eyes: Negative for pain, discharge, redness, itching and visual  disturbance. Needs glasses Neck: Negative for pain, stiffness Respiratory: Negative for cough, wheezing. Positive for occassional shortness of breath with exertion. Cardiovascular: Negative for chest pain, palpitations and leg swelling. Gastrointestinal: Negative for abdominal pain, nausea, vomiting, diarrhea, constipations Genitourinary: Negative for dysuria, urgency, frequency, hematuria,  Musculoskeletal: Negative for back pain, joint pain, joint  swelling, and gait problem.Negative for weakness. Neurological: Negative for dizziness, tremors, seizures, syncope,   light-headedness, numbness and headaches.  Hematological: Negative for easy bruising or bleeding Psychiatric/Behavioral: Negative for depression, decreased concentration, confusion. She does experience  mild anxiety.   Objective:   Filed Vitals:   02/02/15 1453  BP: 130/96  Pulse: 65  Temp: 98.4 F (36.9 C)  Resp: 18    Physical Exam: Constitutional: Patient appears well-developed and well-nourished. No distress. HENT: Normocephalic, atraumatic, External right and left ear normal. Oropharynx is clear and moist.  Eyes: Conjunctivae and EOM are normal. PERRLA, no scleral icterus. Neck: Normal ROM. Neck supple. No lymphadenopathy, No thyromegaly. CVS: RRR, S1/S2 +, no murmurs, no gallops, no rubs Pulmonary: Effort and breath sounds normal, no stridor, rhonchi, wheezes, rales.  Abdominal: Soft. Normoactive BS,, no distension, tenderness, rebound or guarding.  Musculoskeletal: Normal range of motion. No edema and no tenderness.  Neuro: Alert.Normal muscle tone coordination. Non-focal Skin: Skin is warm and dry. No rash noted. Not diaphoretic. No erythema. No pallor. Psychiatric: Normal mood and affect. Behavior, judgment, thought content normal.  Lab Results  Component Value Date   WBC 6.3 02/10/2014   HGB 16.7* 02/10/2014   HCT 51.4* 02/10/2014   MCV 95.0 02/10/2014   PLT 180 02/10/2014   Lab Results  Component Value Date   CREATININE 0.8 03/28/2014   BUN 15 03/28/2014   NA 138 03/28/2014   K 3.8 03/28/2014   CL 104 03/28/2014   CO2 26 03/28/2014    Lab Results  Component Value Date   HGBA1C 5.8* 02/10/2014   Lipid Panel     Component Value Date/Time   CHOL 204* 02/10/2014 1130   TRIG 121 02/10/2014 1130   HDL 62 02/10/2014 1130   CHOLHDL 3.3 02/10/2014 1130   VLDL 24 02/10/2014 1130   LDLCALC 118* 02/10/2014 1130       Assessment and plan:     Establish Care -I have reviewed her documented and self reported, histories, problems and medications -I have reviewed her health maintenance issues and have suggested she address them soon. -Will review labs and address any issues.   Chronic systolic heart failure  - furosemide (LASIX) 40 MG tablet; Take 1  tablet (40 mg total) by mouth daily.  Dispense: 30 tablet; Refill: 11 - losartan (COZAAR) 50 MG tablet; Take 1 tablet (50 mg total) by mouth daily.  Dispense: 30 tablet; Refill: 11  HTN (hypertension), malignant  - COMPLETE METABOLIC PANEL WITH GFR - CBC with Differential - Lipid panel - TSH - furosemide (LASIX) 40 MG tablet; Take 1 tablet (40 mg total) by mouth daily.  Dispense: 30 tablet; Refill: 11 - losartan (COZAAR) 50 MG tablet; Take 1 tablet (50 mg total) by mouth daily.  Dispense: 30 tablet; Refill: 11 -Aldactone 25 mg daily, #30 with 11 refills -Coreg 6.25 #60. One po bid #60 with 11 refills        Return in about 6 months (around 08/05/2015) for HTN.  The patient was given clear instructions to go to ER or return to medical center if symptoms don't improve, worsen or new problems develop. The patient verbalized understanding.  02/02/2015, 4:39 PM

## 2015-02-02 NOTE — Patient Instructions (Signed)
Continue current medications for hypertension You need to make an appointment for tetanus and pneumonia in about a month. Remember to get your flu shot. Keep working on diet and exercise to lose a little weight Follow-up for general recheck in 6 months.

## 2015-02-03 LAB — HIV ANTIBODY (ROUTINE TESTING W REFLEX): HIV 1&2 Ab, 4th Generation: NONREACTIVE

## 2015-02-03 LAB — HEPATITIS C ANTIBODY: HCV Ab: NEGATIVE

## 2015-04-08 ENCOUNTER — Other Ambulatory Visit: Payer: Self-pay | Admitting: Nurse Practitioner

## 2015-04-13 ENCOUNTER — Telehealth: Payer: Self-pay | Admitting: Family Medicine

## 2015-04-13 ENCOUNTER — Other Ambulatory Visit: Payer: Self-pay | Admitting: Nurse Practitioner

## 2015-04-13 NOTE — Telephone Encounter (Signed)
Rx(s) sent to pharmacy electronically.  

## 2015-04-13 NOTE — Telephone Encounter (Signed)
[facility] sent request for refill:  furosemide (LASIX) 40 MG tablet

## 2015-04-27 ENCOUNTER — Other Ambulatory Visit: Payer: Self-pay | Admitting: Nurse Practitioner

## 2015-06-18 ENCOUNTER — Ambulatory Visit (INDEPENDENT_AMBULATORY_CARE_PROVIDER_SITE_OTHER): Payer: Medicaid Other | Admitting: Internal Medicine

## 2015-06-18 ENCOUNTER — Encounter: Payer: Self-pay | Admitting: Internal Medicine

## 2015-06-18 VITALS — BP 138/92 | HR 58 | Ht 67.0 in | Wt 227.0 lb

## 2015-06-18 DIAGNOSIS — I10 Essential (primary) hypertension: Secondary | ICD-10-CM | POA: Diagnosis not present

## 2015-06-18 DIAGNOSIS — I447 Left bundle-branch block, unspecified: Secondary | ICD-10-CM

## 2015-06-18 DIAGNOSIS — I340 Nonrheumatic mitral (valve) insufficiency: Secondary | ICD-10-CM | POA: Diagnosis not present

## 2015-06-18 DIAGNOSIS — Z8679 Personal history of other diseases of the circulatory system: Secondary | ICD-10-CM | POA: Diagnosis not present

## 2015-06-18 NOTE — Progress Notes (Signed)
OFFICE NOTE  Chief Complaint:  No complaints  Primary Care Physician: No PCP Per Patient  HPI:  Julia [name] is a 59 yo female who was recently admitted to [facility] with a 3 day history of worsening SOB and DOE. Found to have LBBB, malignant HTN and systolic HF with EF at [id number] with MR by echo. She underwent cardiac catheterization which showed normal coronaries. Diuresed almost 3 liters. Her blood pressure was better controlled at discharge and she was seen in follow-up by Truitt Merle, NP, who further up titrated her beta blocker. She's also had a reduction in her Lasix. A repeat echo was performed in November which shows an EF has improved up to 50%. She has successfully stopped smoking for the past 6 months. She has had some weight gain was worked on exercise and portion changes. Her energy level has improved and generally feels much better.  Julia [name] returns today for follow-up. She was seen by Truitt Merle in September 2015 for hospital follow-up. At that time EF was 35% and she ordered a repeat echo. The echo was done in November 2015 which showed an EF opted to 50% with moderate MR. She was advised at that time to take her Lasix as needed and additional medication adjustments were made. Since that time she's been doing well denies any chest pain or worsening shortness of breath. In the hospital she was told that she would be on permanent disability however that was never arranged and she is not receiving any income with regards to that. She is wondering now whether or not she could go back to work.  PMHx:  Past Medical History  Diagnosis Date  . Hypertension   . Systolic CHF ([place])     a. 2D ECHO: 02/09/2014; EF 35-30%, mild LV dilation, severe, diffuse hypokinesis, regional WMAs cannot be excluded. Ventricular septum with mild dyssynergy c/w LBBB, mild AR, severe MR, severely dilated LA.  . Mitral regurgitation     a. severe by ECHO  01/2014  . Tobacco abuse   . Allergy   .  Anxiety   . CHF (congestive heart failure) ([place])   . GERD (gastroesophageal reflux disease)     Past Surgical History  Procedure Laterality Date  . 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 Abdominal hysterectomy      partial  . Tubal ligation    . Cardiac catheterization  01/2014    Normal coronaries. EF of 35 to 40%  . Left heart catheterization with coronary angiogram N/A 02/10/2014    Procedure: LEFT HEART CATHETERIZATION WITH CORONARY ANGIOGRAM;  Surgeon: Sinclair Grooms, MD;  Location: [facility] CATH LAB;  Service: Cardiovascular;  Laterality: N/A;    FAMHx:  Family History  Problem Relation Age of Onset  . Diabetes Mother   . Hypertension Mother   . Diabetes Father   . Heart disease Father   . Diabetes Sister   . Drug abuse Sister   . Hypertension Sister   . Mental illness Sister   . Stroke Sister   . Miscarriages / Stillbirths Sister   . Alcohol abuse Brother   . Cancer Brother   . Alcohol abuse Maternal Aunt   . Alcohol abuse Maternal Uncle   . Alcohol abuse Paternal Uncle   . Cancer Paternal Uncle     SOCHx:   reports that she quit smoking about 16 months ago. Her smoking use included Cigarettes. She smoked 0.50 packs per  day. She does not have any smokeless tobacco history on file. She reports that she does not drink alcohol or use illicit drugs.  ALLERGIES:  No Known Allergies  ROS: A comprehensive review of systems was negative.  HOME MEDS: Current Outpatient Prescriptions  Medication Sig Dispense Refill  . carvedilol (COREG) 6.25 MG tablet Take 1.5 tablets (9.375 mg total) by mouth 2 (two) times daily with a meal. 90 tablet 11  . furosemide (LASIX) 40 MG tablet Take 40 mg by mouth daily as needed for fluid or edema.    Marland Kitchen losartan (COZAAR) 50 MG tablet Take 1 tablet (50 mg total) by mouth daily. 30 tablet 11  . spironolactone (ALDACTONE) 25 MG tablet Take 12.5 mg by mouth daily.     No current facility-administered medications for this  visit.    LABS/IMAGING: No results found for this or any previous visit (from the past 48 hour(s)). No results found.  VITALS: BP 138/92 mmHg  Pulse 58  Ht 5\' 7"  (1.702 m)  Wt 227 lb (102.967 kg)  BMI 35.55 kg/m2  EXAM: General appearance: alert and no distress Neck: no carotid bruit and no JVD Lungs: clear to auscultation bilaterally Heart: regular rate and rhythm, S1, S2 normal, no murmur, click, rub or gallop Abdomen: soft, non-tender; bowel sounds normal; no masses,  no organomegaly Extremities: extremities normal, atraumatic, no cyanosis or edema Pulses: 2+ and symmetric Skin: Skin color, texture, turgor normal. No rashes or lesions Neurologic: Grossly normal Psych: Normal  EKG: Sinus bradycardia with left bundle branch block and 58  ASSESSMENT: 1. Left bundle branch block 2. Acute systolic congestive heart failure-EF was 35%, now 50% 3. Malignant hypertension-controlled  PLAN: 1.   Julia [name] has had big improvement in blood pressure control. Her EF is now normalized. She is no longer short of breath. Her coronary arteries were normal at cath. She should continue her current medications for heart failure. At this point I think there is no reason why she could not go back to physical activity. She should easily be able to lift 10 pounds and walk several 100 yards without being short of breath. She has gained about 10-15 pounds but she stop smoking last August and has been doing a very good job about staying off of cigarettes. Plan to see her back annually or sooner as necessary.  Pixie Casino, MD, [facility] Attending Cardiologist [facility]  Nadean Corwin Yousuf Ager 06/18/2015, 11:11 AM

## 2015-06-18 NOTE — Patient Instructions (Signed)
Dr Debara Pickett has made no changes today in your current medications or treatment plan.  Your physician recommends that you schedule a follow-up appointment in 1 year. You will receive a reminder letter in the mail two months in advance. If you don't receive a letter, please call our office to schedule the follow-up appointment.  If you need a refill on your cardiac medications before your next appointment, please call your pharmacy.   Okay to go back to work without restrictions!!   You may take over-the-counter Pepcid or Zantac as needed for heartburn/acid reflux.   Merry Christmas and Happy New Year!!!

## 2015-07-01 HISTORY — PX: MOUTH SURGERY: SHX715

## 2015-08-06 ENCOUNTER — Ambulatory Visit: Payer: Medicaid Other | Admitting: Family Medicine

## 2015-08-15 IMAGING — CT CT ANGIO CHEST
1 of 2 series · 20 of 32 positions shown · IV contrast (OMNIPAQUE 350)
Comparison: Chest x-ray dated 02/08/2014

CLINICAL DATA: Sudden onset of shortness of breath. Pulmonary
edema.

EXAM:
CT ANGIOGRAPHY CHEST WITH CONTRAST
TECHNIQUE: Multidetector CT imaging of the chest was performed using the
standard protocol during bolus administration of intravenous
contrast. Multiplanar CT image reconstructions and MIPs were
obtained to evaluate the vascular anatomy.
CONTRAST:  100mL OMNIPAQUE IOHEXOL 350 MG/ML SOLN

[Series 10: thins for pacs · axial · 0.74mm/px · z∈[+1638,+1867]mm · 20 of 255 slices shown]
[im 13/255  lung]
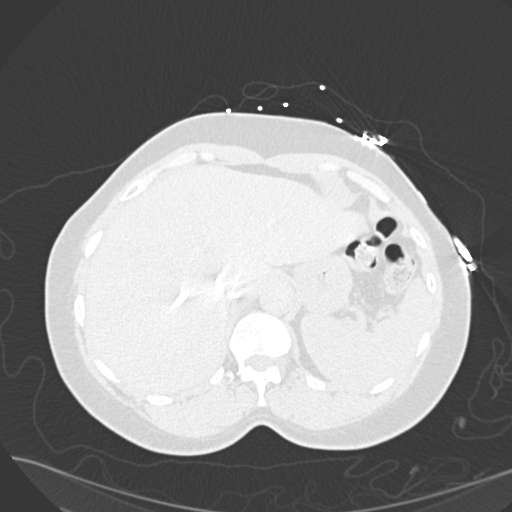
[im 26/255  mediastinal]
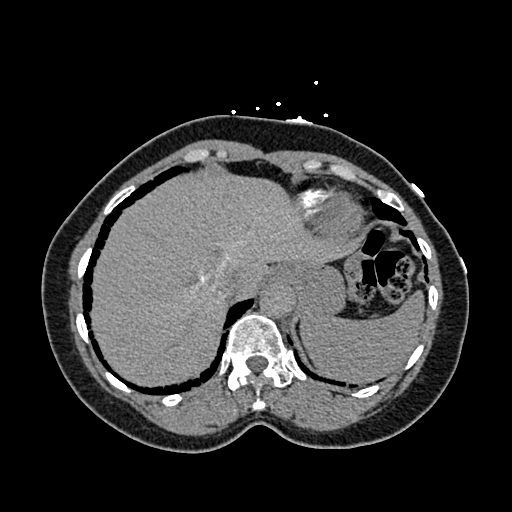
[im 39/255  lung]
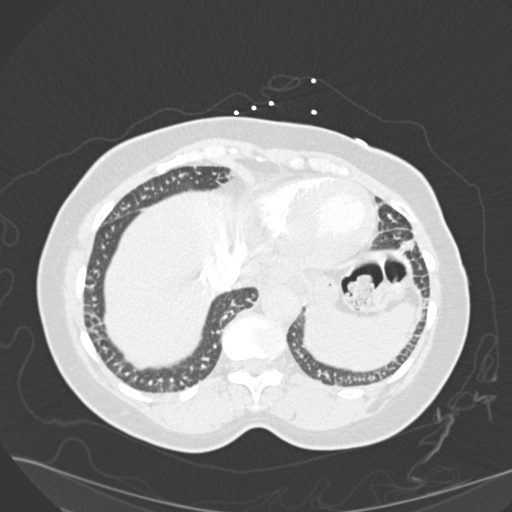
[im 51/255  mediastinal]
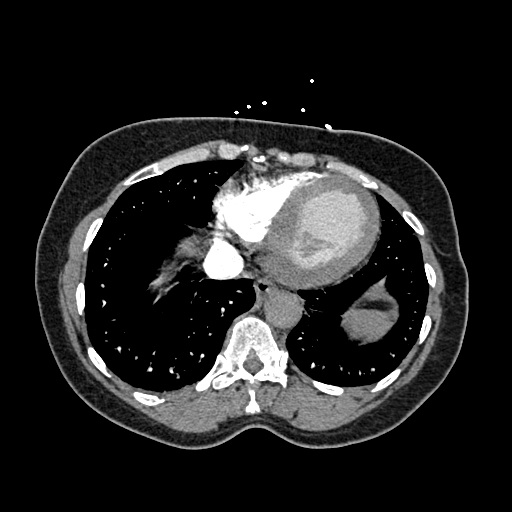
[im 64/255  lung]
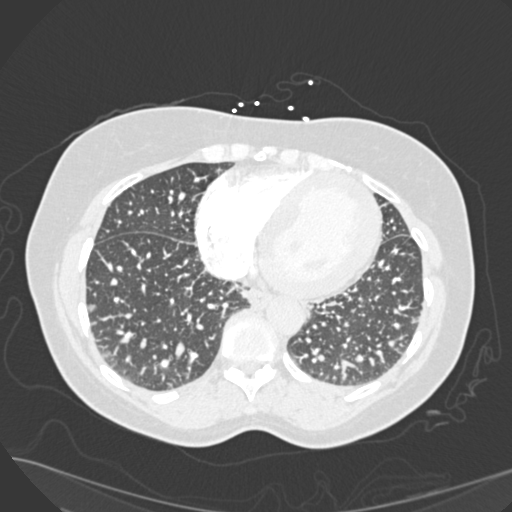
[im 85/255  mediastinal]
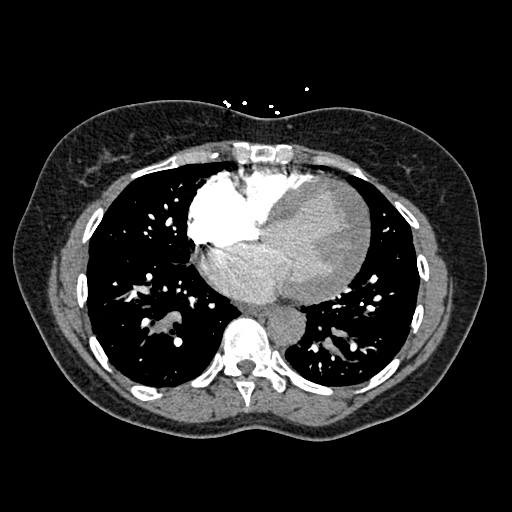
[im 89/255  lung]
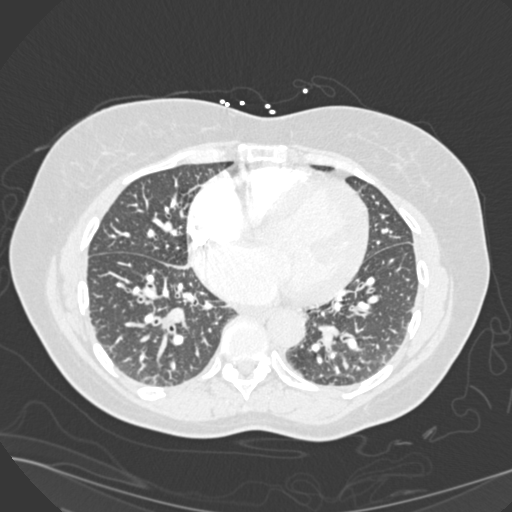
[im 102/255  mediastinal]
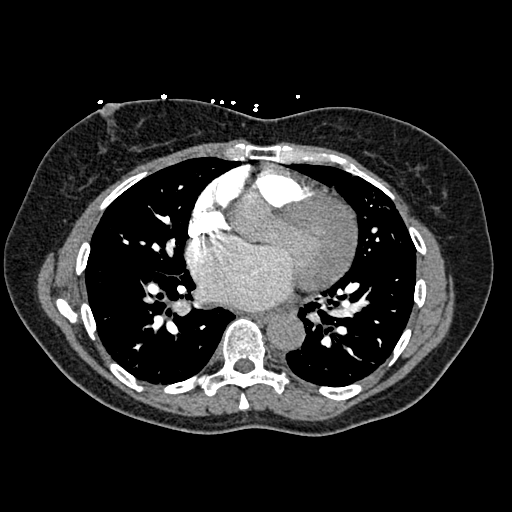
[im 115/255  lung]
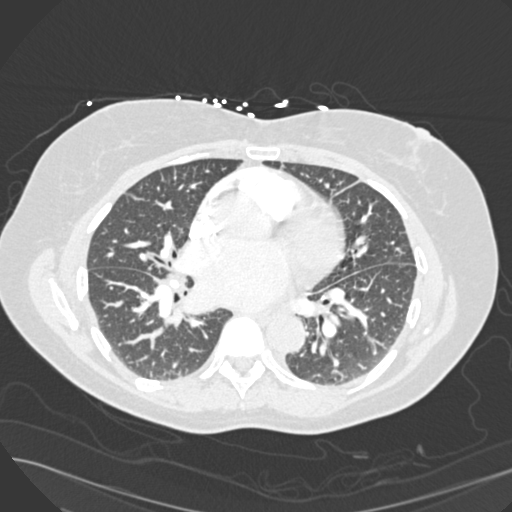
[im 118/255  mediastinal]
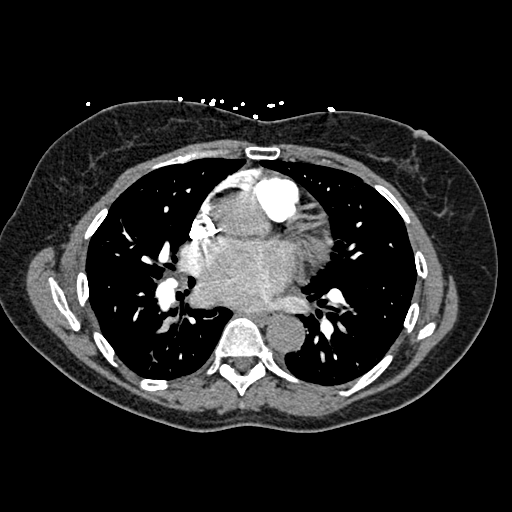
[im 128/255  lung]
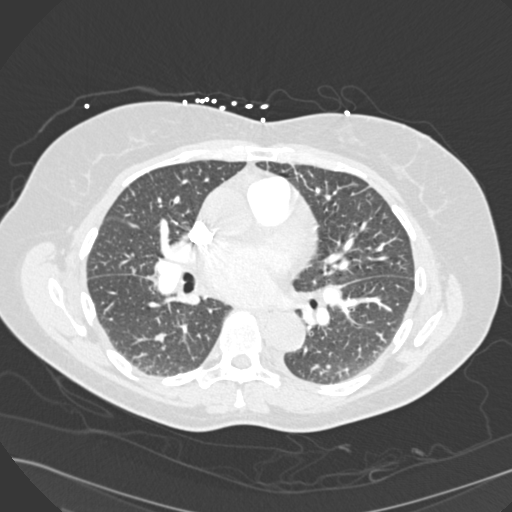
[im 140/255  mediastinal]
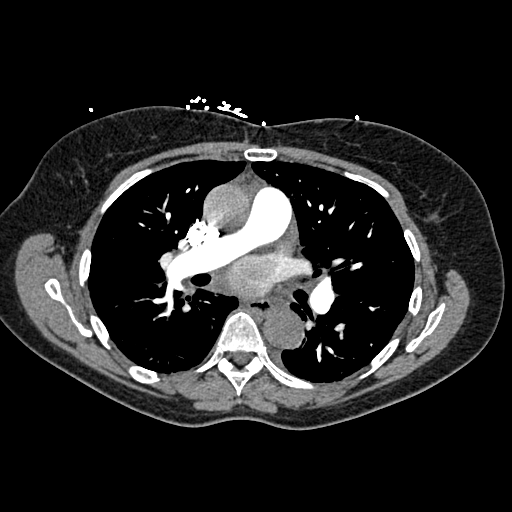
[im 153/255  lung]
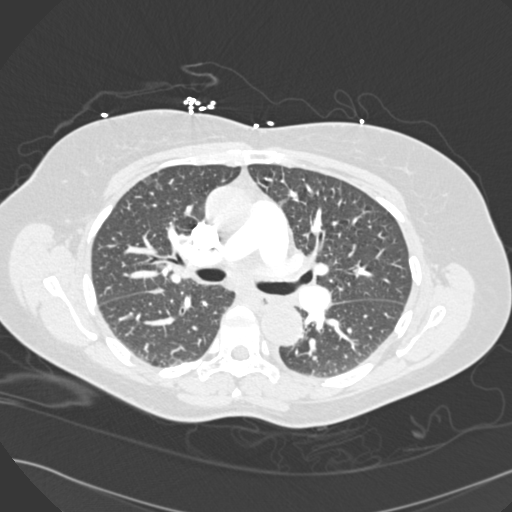
[im 166/255  mediastinal]
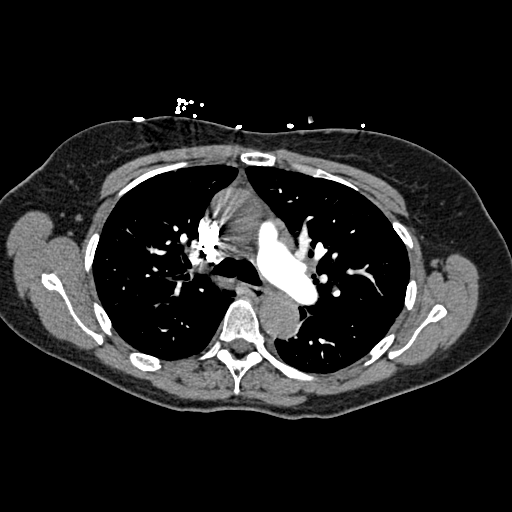
[im 170/255  lung]
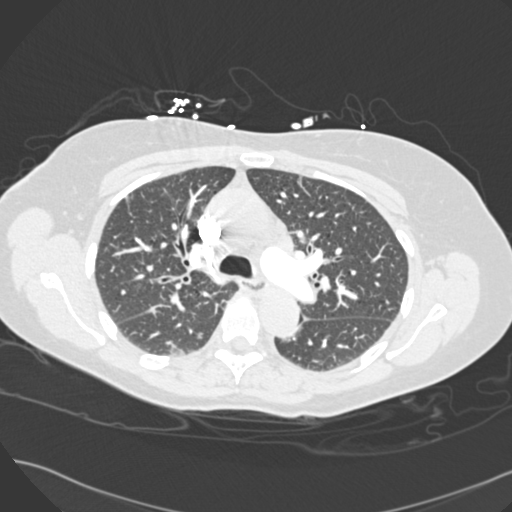
[im 191/255  mediastinal]
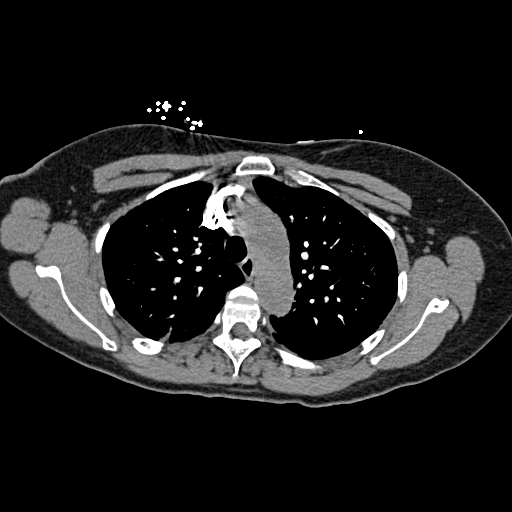
[im 204/255  lung]
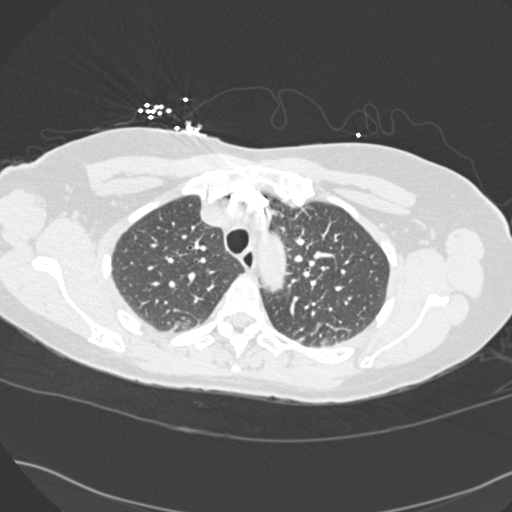
[im 216/255  mediastinal]
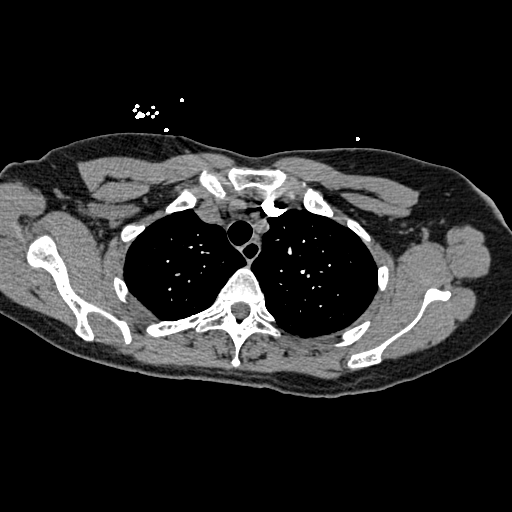
[im 229/255  lung]
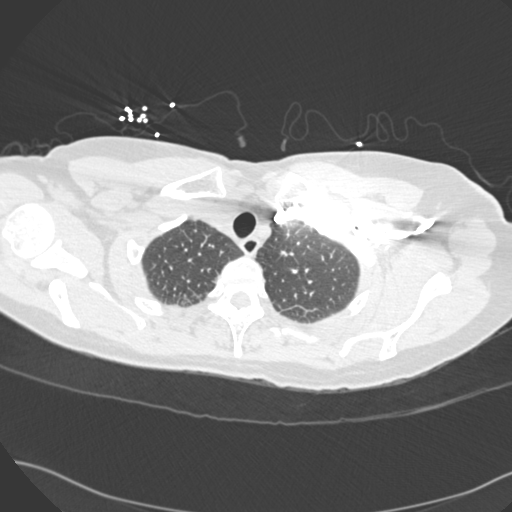
[im 242/255  mediastinal]
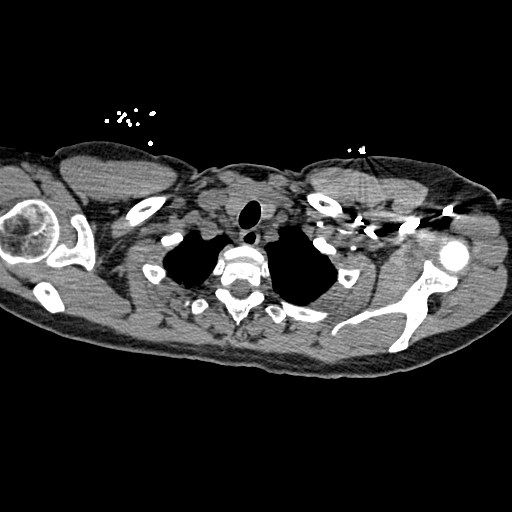

[20 of 32 positions shown; findings below may reference images not displayed]

FINDINGS: There are no pulmonary emboli. The patient has diffuse interstitial
pulmonary edema, most prominent at the lung bases. There is slight
cardiomegaly with dilatation of the left ventricle. No effusions.
The visualized portion of the upper abdomen is normal. No osseous
abnormality

Review of the MIP images confirms the above findings.
IMPRESSION: Interstitial pulmonary edema. Borderline cardiomegaly. Left
ventricle appears slightly dilated. No pulmonary emboli.

## 2015-08-20 ENCOUNTER — Other Ambulatory Visit: Payer: Self-pay | Admitting: Family Medicine

## 2015-08-20 ENCOUNTER — Encounter: Payer: Self-pay | Admitting: Family Medicine

## 2015-08-20 ENCOUNTER — Ambulatory Visit (INDEPENDENT_AMBULATORY_CARE_PROVIDER_SITE_OTHER): Payer: Medicaid Other | Admitting: Family Medicine

## 2015-08-20 VITALS — BP 132/96 | HR 55 | Temp 98.2°F | Ht 67.0 in | Wt 220.0 lb

## 2015-08-20 DIAGNOSIS — Z0000 Encounter for general adult medical examination without abnormal findings: Secondary | ICD-10-CM | POA: Diagnosis not present

## 2015-08-20 DIAGNOSIS — Z1231 Encounter for screening mammogram for malignant neoplasm of breast: Secondary | ICD-10-CM

## 2015-08-20 DIAGNOSIS — I10 Essential (primary) hypertension: Secondary | ICD-10-CM

## 2015-08-20 MED ORDER — FUROSEMIDE 40 MG PO TABS: ORAL_TABLET | ORAL | Status: DC

## 2015-08-20 NOTE — Progress Notes (Signed)
Patient ID: Julia [name], female   DOB: Jan 28, 1956, 60 y.o.   MRN: [id number]   Julia [name], is a 60 y.o. female  [id number]  [id number]  DOB - 1956/03/19  CC:  Chief Complaint  Patient presents with  . 6 month follow up    saw cardiologist in December 2016 still has occas chest discomfort, had teeth removed 3 weeks ago and blood pressure was elevated, had vaginal discharge recently with odor, has a new partner       HPI: Julia [name] is a 60 y.o. female here for follow-up hypertension. She is followed by cardiology for mitral regurg, LV dilitation, LBBB. She was her cardiologist recently and he increased her spirolactone from 12.5 to 25 daily. She reports she is not taking her Lasix 40 as prescribed by her previous cardiologist. Her BP today is not in good control.   Health Maintenance:  She declines Tdap and influenza, needs mammogram, PAP and colonoscopy  No Known Allergies Past Medical History  Diagnosis Date  . Hypertension   . Systolic CHF ([place])     a. 2D ECHO: 02/09/2014; EF 35-30%, mild LV dilation, severe, diffuse hypokinesis, regional WMAs cannot be excluded. Ventricular septum with mild dyssynergy c/w LBBB, mild AR, severe MR, severely dilated LA.  . Mitral regurgitation     a. severe by ECHO  01/2014  . Tobacco abuse   . Allergy   . Anxiety   . CHF (congestive heart failure) ([place])   . GERD (gastroesophageal reflux disease)    Current Outpatient Prescriptions on File Prior to Visit  Medication Sig Dispense Refill  . carvedilol (COREG) 6.25 MG tablet Take 1.5 tablets (9.375 mg total) by mouth 2 (two) times daily with a meal. 90 tablet 11  . losartan (COZAAR) 50 MG tablet Take 1 tablet (50 mg total) by mouth daily. 30 tablet 11  . spironolactone (ALDACTONE) 25 MG tablet Take 25 mg by mouth daily.     No current facility-administered medications on file prior to visit.   Family History  Problem Relation Age of Onset  . Diabetes Mother   . Hypertension  Mother   . Diabetes Father   . Heart disease Father   . Diabetes Sister   . Drug abuse Sister   . Hypertension Sister   . Mental illness Sister   . Stroke Sister   . Miscarriages / Stillbirths Sister   . Alcohol abuse Brother   . Cancer Brother   . Alcohol abuse Maternal Aunt   . Alcohol abuse Maternal Uncle   . Alcohol abuse Paternal Uncle   . Cancer Paternal Uncle    Social History   Social History  . Marital Status: Single    Spouse Name: N/A  . Number of Children: N/A  . Years of Education: N/A   Occupational History  . Not on file.   Social History Main Topics  . Smoking status: Former Smoker -- 0.50 packs/day    Types: Cigarettes    Quit date: 02/15/2014  . Smokeless tobacco: Not on file  . Alcohol Use: No  . Drug Use: No  . Sexual Activity: Yes   Other Topics Concern  . Not on file   Social History Narrative    Review of Systems: Constitutional: Negative for fever, chills, appetite change, weight loss,  Fatigue. Skin: Negative for rashes or lesions of concern. HENT: Negative for ear pain, ear discharge.nose bleeds Eyes: Negative for pain, discharge, redness, itching and visual disturbance. Neck: Negative for pain, stiffness  Respiratory: Negative for cough, shortness of breath,   Cardiovascular: Negative for chest pain, palpitations and leg swelling. Gastrointestinal: Negative for abdominal pain, nausea, vomiting, diarrhea, constipations Genitourinary: Negative for dysuria, urgency, frequency, hematuria,  Musculoskeletal: Negative for back pain, joint pain, joint  swelling, and gait problem.Negative for weakness. Neurological: Negative for  tremors, seizures, syncope,   light-headedness, numbness and headaches. Positive for occassional dizziness Hematological: Negative for easy bruising or bleeding Psychiatric/Behavioral: Negative for depression, anxiety, decreased concentration, confusion   Objective:   Filed Vitals:   08/20/15 1336 08/20/15 1351   BP: 135/103 132/96  Pulse: 55   Temp: 98.2 F (36.8 C)     Physical Exam: Constitutional: Patient appears well-developed and well-nourished. No distress. HENT: Normocephalic, atraumatic, External right and left ear normal. Oropharynx is clear and moist.  Eyes: Conjunctivae and EOM are normal. PERRLA, no scleral icterus. Neck: Normal ROM. Neck supple. No lymphadenopathy, No thyromegaly. CVS: RRR, S1/S2 +, no murmurs, no gallops, no rubs Pulmonary: Effort and breath sounds normal, no stridor, rhonchi, wheezes, rales.  Abdominal: Soft. Normoactive BS,, no distension, tenderness, rebound or guarding.  Musculoskeletal: Normal range of motion. No edema and no tenderness.  Neuro: Alert.Normal muscle tone coordination. Non-focal Skin: Skin is warm and dry. No rash noted. Not diaphoretic. No erythema. No pallor. Psychiatric: Normal mood and affect. Behavior, judgment, thought content normal.  Lab Results  Component Value Date   WBC 5.9 02/02/2015   HGB 13.8 02/02/2015   HCT 40.7 02/02/2015   MCV 90.2 02/02/2015   PLT 201 02/02/2015   Lab Results  Component Value Date   CREATININE 0.82 02/02/2015   BUN 12 02/02/2015   NA 141 02/02/2015   K 4.5 02/02/2015   CL 105 02/02/2015   CO2 21 02/02/2015    Lab Results  Component Value Date   HGBA1C 5.8* 02/10/2014   Lipid Panel     Component Value Date/Time   CHOL 195 02/02/2015 1543   TRIG 181* 02/02/2015 1543   HDL 50 02/02/2015 1543   CHOLHDL 3.9 02/02/2015 1543   VLDL 36* 02/02/2015 1543   LDLCALC 109 02/02/2015 1543       Assessment and plan:   1. Essential hypertension  - COMPLETE METABOLIC PANEL WITH GFR - CBC with Differential - Lipid panel - TSH -Resume Lasix 40 mg, 1.2 p q day -Continue spirolactone 25, one po daily -Continue carvedilol 6.25 mg, one po bid -Continue losartan 50 mg , one po q day. -continue low salt diet.   2. Healthcare maintenance  - MM DIGITAL SCREENING BILATERAL; Future - Ambulatory  referral to Gastroenterology  3. Desire to lose weight -Look up and read about  "My Plate" -I have advised strongly against using any diet medications due to her cardiac issues.   Return in about 6 months (around 02/17/2016).  The patient was given clear instructions to go to ER or return to medical center if symptoms don't improve, worsen or new problems develop. The patient verbalized understanding.    Micheline Chapman FNP  08/20/2015, 2:33 PM

## 2015-08-22 ENCOUNTER — Encounter: Payer: Self-pay | Admitting: Gastroenterology

## 2015-08-24 ENCOUNTER — Other Ambulatory Visit: Payer: Medicaid Other

## 2015-08-31 ENCOUNTER — Other Ambulatory Visit (INDEPENDENT_AMBULATORY_CARE_PROVIDER_SITE_OTHER): Payer: Medicaid Other

## 2015-08-31 DIAGNOSIS — I10 Essential (primary) hypertension: Secondary | ICD-10-CM

## 2015-08-31 LAB — CBC WITH DIFFERENTIAL/PLATELET
BASOS ABS: 0 10*3/uL (ref 0.0–0.1)
Basophils Relative: 0 % (ref 0–1)
EOS PCT: 3 % (ref 0–5)
Eosinophils Absolute: 0.2 10*3/uL (ref 0.0–0.7)
HEMATOCRIT: 43.5 % (ref 36.0–46.0)
HEMOGLOBIN: 13.9 g/dL (ref 12.0–15.0)
LYMPHS PCT: 38 % (ref 12–46)
Lymphs Abs: 2.3 10*3/uL (ref 0.7–4.0)
MCH: 30 pg (ref 26.0–34.0)
MCHC: 32 g/dL (ref 30.0–36.0)
MCV: 93.8 fL (ref 78.0–100.0)
MPV: 11 fL (ref 8.6–12.4)
Monocytes Absolute: 0.4 10*3/uL (ref 0.1–1.0)
Monocytes Relative: 6 % (ref 3–12)
NEUTROS ABS: 3.2 10*3/uL (ref 1.7–7.7)
NEUTROS PCT: 53 % (ref 43–77)
Platelets: 243 10*3/uL (ref 150–400)
RBC: 4.64 MIL/uL (ref 3.87–5.11)
RDW: 13.5 % (ref 11.5–15.5)
WBC: 6.1 10*3/uL (ref 4.0–10.5)

## 2015-08-31 LAB — COMPLETE METABOLIC PANEL WITH GFR
ALT: 15 U/L (ref 6–29)
AST: 17 U/L (ref 10–35)
Albumin: 4.4 g/dL (ref 3.6–5.1)
Alkaline Phosphatase: 63 U/L (ref 33–130)
BUN: 10 mg/dL (ref 7–25)
CALCIUM: 9.2 mg/dL (ref 8.6–10.4)
CHLORIDE: 101 mmol/L (ref 98–110)
CO2: 28 mmol/L (ref 20–31)
Creat: 0.7 mg/dL (ref 0.50–1.05)
Glucose, Bld: 90 mg/dL (ref 65–99)
POTASSIUM: 3.9 mmol/L (ref 3.5–5.3)
Sodium: 137 mmol/L (ref 135–146)
Total Bilirubin: 0.5 mg/dL (ref 0.2–1.2)
Total Protein: 7.4 g/dL (ref 6.1–8.1)

## 2015-08-31 LAB — LIPID PANEL
Cholesterol: 188 mg/dL (ref 125–200)
HDL: 53 mg/dL (ref >=46)
LDL CALC: 117 mg/dL (ref <130)
TRIGLYCERIDES: 89 mg/dL (ref <150)
Total CHOL/HDL Ratio: 3.5 Ratio (ref <=5.0)
VLDL: 18 mg/dL (ref <30)

## 2015-09-06 ENCOUNTER — Ambulatory Visit: Payer: Medicaid Other

## 2015-09-18 ENCOUNTER — Ambulatory Visit
Admission: RE | Admit: 2015-09-18 | Discharge: 2015-09-18 | Disposition: A | Discharge status: Home / Self Care | Payer: Medicaid Other | Source: Ambulatory Visit | Attending: Family Medicine | Admitting: Family Medicine

## 2015-09-18 DIAGNOSIS — Z1231 Encounter for screening mammogram for malignant neoplasm of breast: Secondary | ICD-10-CM

## 2015-10-01 ENCOUNTER — Ambulatory Visit (AMBULATORY_SURGERY_CENTER): Payer: Self-pay | Admitting: *Deleted

## 2015-10-01 VITALS — Ht 66.5 in | Wt 224.0 lb

## 2015-10-01 DIAGNOSIS — Z1211 Encounter for screening for malignant neoplasm of colon: Secondary | ICD-10-CM

## 2015-10-01 MED ORDER — NA SULFATE-K SULFATE-MG SULF 17.5-3.13-1.6 GM/177ML PO SOLN: 1.0000 | Freq: Once | ORAL | Status: DC

## 2015-10-01 NOTE — Progress Notes (Signed)
No egg or soy allergy known to patient  No issues with past sedation with any surgeries  or procedures, no intubation problems  No diet pills per patient No home 02 use per patient  No blood thinners per patient  Pt denies issues with constipation  emmi declined

## 2015-10-11 ENCOUNTER — Encounter: Payer: Medicaid Other | Admitting: Gastroenterology

## 2015-10-11 ENCOUNTER — Telehealth: Payer: Self-pay | Admitting: Gastroenterology

## 2015-10-11 NOTE — Telephone Encounter (Signed)
ok 

## 2015-10-15 ENCOUNTER — Other Ambulatory Visit: Payer: Self-pay | Admitting: Internal Medicine

## 2015-10-15 NOTE — Telephone Encounter (Signed)
Rx(s) sent to pharmacy electronically.  

## 2015-12-17 ENCOUNTER — Other Ambulatory Visit: Payer: Self-pay | Admitting: Family Medicine

## 2016-01-20 ENCOUNTER — Other Ambulatory Visit: Payer: Self-pay | Admitting: Family Medicine

## 2016-02-21 ENCOUNTER — Other Ambulatory Visit: Payer: Self-pay | Admitting: Family Medicine

## 2016-02-21 ENCOUNTER — Ambulatory Visit: Payer: Medicaid Other | Admitting: Family Medicine

## 2016-02-21 DIAGNOSIS — E876 Hypokalemia: Secondary | ICD-10-CM

## 2016-02-21 DIAGNOSIS — I5022 Chronic systolic (congestive) heart failure: Secondary | ICD-10-CM

## 2016-02-21 DIAGNOSIS — I10 Essential (primary) hypertension: Secondary | ICD-10-CM

## 2016-03-11 ENCOUNTER — Other Ambulatory Visit: Payer: Self-pay | Admitting: Family Medicine

## 2016-04-01 ENCOUNTER — Other Ambulatory Visit: Payer: Self-pay | Admitting: Family Medicine

## 2016-04-07 ENCOUNTER — Telehealth: Payer: Self-pay

## 2016-04-07 NOTE — Telephone Encounter (Signed)
Refill request for lasix and spironolactone. Patient has scheduled an appointment but can not get in until November. Should she be on both of these? Please advise. Thanks!

## 2016-04-08 MED ORDER — FUROSEMIDE 40 MG PO TABS: ORAL_TABLET | ORAL | 0 refills | Status: DC

## 2016-04-08 MED ORDER — SPIRONOLACTONE 25 MG PO TABS: 25.0000 mg | ORAL_TABLET | Freq: Every day | ORAL | 0 refills | Status: DC

## 2016-04-08 NOTE — Telephone Encounter (Signed)
Spoke to linda, these are ok to refill. Has  Been sent into pharmacy. Thanks!

## 2016-04-24 ENCOUNTER — Encounter: Payer: Self-pay | Admitting: Family Medicine

## 2016-04-24 ENCOUNTER — Ambulatory Visit (INDEPENDENT_AMBULATORY_CARE_PROVIDER_SITE_OTHER): Payer: Medicaid Other | Admitting: Family Medicine

## 2016-04-24 VITALS — BP 140/79 | HR 80 | Temp 98.7°F | Resp 16 | Ht 67.0 in | Wt 223.0 lb

## 2016-04-24 DIAGNOSIS — Z23 Encounter for immunization: Secondary | ICD-10-CM

## 2016-04-24 DIAGNOSIS — Z1211 Encounter for screening for malignant neoplasm of colon: Secondary | ICD-10-CM | POA: Diagnosis not present

## 2016-04-24 DIAGNOSIS — Z131 Encounter for screening for diabetes mellitus: Secondary | ICD-10-CM

## 2016-04-24 DIAGNOSIS — I10 Essential (primary) hypertension: Secondary | ICD-10-CM

## 2016-04-24 DIAGNOSIS — I5022 Chronic systolic (congestive) heart failure: Secondary | ICD-10-CM

## 2016-04-24 DIAGNOSIS — E876 Hypokalemia: Secondary | ICD-10-CM

## 2016-04-24 LAB — COMPLETE METABOLIC PANEL WITH GFR
ALT: 12 U/L (ref 6–29)
AST: 14 U/L (ref 10–35)
Albumin: 4.2 g/dL (ref 3.6–5.1)
Alkaline Phosphatase: 64 U/L (ref 33–130)
BUN: 11 mg/dL (ref 7–25)
CALCIUM: 9.6 mg/dL (ref 8.6–10.4)
CHLORIDE: 102 mmol/L (ref 98–110)
CO2: 26 mmol/L (ref 20–31)
CREATININE: 0.87 mg/dL (ref 0.50–0.99)
GFR, EST AFRICAN AMERICAN: 84 mL/min (ref >=60)
GFR, EST NON AFRICAN AMERICAN: 73 mL/min (ref >=60)
Glucose, Bld: 116 mg/dL — ABNORMAL HIGH (ref 65–99)
POTASSIUM: 4.4 mmol/L (ref 3.5–5.3)
Sodium: 140 mmol/L (ref 135–146)
Total Bilirubin: 0.5 mg/dL (ref 0.2–1.2)
Total Protein: 7.4 g/dL (ref 6.1–8.1)

## 2016-04-24 LAB — TSH: TSH: 2.2 mIU/L

## 2016-04-24 LAB — CBC WITH DIFFERENTIAL/PLATELET
BASOS PCT: 0 %
Basophils Absolute: 0 cells/uL (ref 0–200)
Eosinophils Absolute: 141 cells/uL (ref 15–500)
Eosinophils Relative: 3 %
HEMATOCRIT: 44.8 % (ref 35.0–45.0)
Hemoglobin: 14.8 g/dL (ref 11.7–15.5)
LYMPHS ABS: 1692 {cells}/uL (ref 850–3900)
LYMPHS PCT: 36 %
MCH: 31.4 pg (ref 27.0–33.0)
MCHC: 33 g/dL (ref 32.0–36.0)
MCV: 95.1 fL (ref 80.0–100.0)
MONO ABS: 282 {cells}/uL (ref 200–950)
MPV: 10.8 fL (ref 7.5–12.5)
Monocytes Relative: 6 %
Neutro Abs: 2585 cells/uL (ref 1500–7800)
Neutrophils Relative %: 55 %
Platelets: 229 10*3/uL (ref 140–400)
RBC: 4.71 MIL/uL (ref 3.80–5.10)
RDW: 13.3 % (ref 11.0–15.0)
WBC: 4.7 10*3/uL (ref 3.8–10.8)

## 2016-04-24 MED ORDER — SPIRONOLACTONE 25 MG PO TABS: 25.0000 mg | ORAL_TABLET | Freq: Every day | ORAL | 1 refills | Status: DC

## 2016-04-24 MED ORDER — LOSARTAN POTASSIUM 50 MG PO TABS: 50.0000 mg | ORAL_TABLET | Freq: Every day | ORAL | 1 refills | Status: DC

## 2016-04-24 MED ORDER — FUROSEMIDE 40 MG PO TABS: ORAL_TABLET | ORAL | 1 refills | Status: DC

## 2016-04-24 MED ORDER — CARVEDILOL 6.25 MG PO TABS: ORAL_TABLET | ORAL | 1 refills | Status: DC

## 2016-04-24 MED ORDER — OMEPRAZOLE 20 MG PO CPDR: 20.0000 mg | DELAYED_RELEASE_CAPSULE | Freq: Every day | ORAL | 3 refills | Status: DC

## 2016-04-24 NOTE — Patient Instructions (Signed)
Continue same medications.

## 2016-04-24 NOTE — Progress Notes (Signed)
Julia [name], is a 60 y.o. female  [id number]  [id number]  DOB - 02/15/1956  CC:  Chief Complaint  Patient presents with  . Hypertension  . Anxiety       HPI: Julia [name] is a 60 y.o. female here for follow-up chronic conditions. She has diagnoses of hypertension, CHF, mitral regurg, history of tobacco abuse. He has not seen a cardiologist in almost a year and reports she would like a referral to a different one, as she does not care for the one she has seen in past. She report having occasional right sided chest pain with some fluttering. The chest pain is no suggestive of cardiac pain. She also occ has pain in her right shoulder which is consistent with musculoskeletal pain. She reports anxiety but only in very specific condition (riding with someone else driving).   Health Maintenance: To have Tdap today. Declines flu and pneumonia. Recommended zostivax. Need colonoscopy Mammogram is utd. Has had a hysterectomy.   No Known Allergies Past Medical History:  Diagnosis Date  . Allergy   . Anxiety   . CHF (congestive heart failure) ([place])   . GERD (gastroesophageal reflux disease)   . Hypertension   . Mitral regurgitation    a. severe by ECHO  01/2014  . Systolic CHF ([place])    a. 2D ECHO: 02/09/2014; EF 35-30%, mild LV dilation, severe, diffuse hypokinesis, regional WMAs cannot be excluded. Ventricular septum with mild dyssynergy c/w LBBB, mild AR, severe MR, severely dilated LA.  . Tobacco abuse    Current Outpatient Prescriptions on File Prior to Visit  Medication Sig Dispense Refill  . Na Sulfate-K Sulfate-Mg Sulf (SUPREP BOWEL PREP) SOLN Take 1 kit by mouth once. suprep as directed. No substitutions (Patient not taking: Reported on 04/24/2016) 354 mL 0   No current facility-administered medications on file prior to visit.    Family History  Problem Relation Age of Onset  . Diabetes Mother   . Hypertension Mother   . Diabetes Father   . Heart disease Father   .  Diabetes Sister   . Drug abuse Sister   . Hypertension Sister   . Mental illness Sister   . Stroke Sister   . Miscarriages / Stillbirths Sister   . Alcohol abuse Brother   . Cancer Brother   . Alcohol abuse Maternal Aunt   . Alcohol abuse Maternal Uncle   . Alcohol abuse Paternal Uncle   . Cancer Paternal Uncle   . Colon cancer Neg Hx   . Colon polyps Neg Hx    Social History   Social History  . Marital status: Single    Spouse name: N/A  . Number of children: N/A  . Years of education: N/A   Occupational History  . Not on file.   Social History Main Topics  . Smoking status: Former Smoker    Packs/day: 0.50    Types: Cigarettes    Quit date: 02/15/2014  . Smokeless tobacco: Never Used  . Alcohol use No  . Drug use: No  . Sexual activity: Yes   Other Topics Concern  . Not on file   Social History Narrative  . No narrative on file    Review of Systems: Constitutional: Negative Skin: Negative HENT: Negative  Eyes: Negative, except age related decrease in vision Neck: Negative Respiratory: Negative Cardiovascular: + occ fluttering, chest pain not consistent with cardiac pain Gastrointestinal: Negative Genitourinary: + for frequent heartburn Musculoskeletal: + for left shoulder pain  Neurological: Negative  for Hematological: Negative  Psychiatric/Behavioral: + anxiety   Objective:   Vitals:   04/24/16 0852  BP: 140/79  Pulse: 80  Resp: 16  Temp: 98.7 F (37.1 C)    Physical Exam: Constitutional: Patient appears well-developed and well-nourished. No distress. HENT: Normocephalic, atraumatic, External right and left ear normal. Oropharynx is clear and moist. Teeth in poor repair Eyes: Conjunctivae and EOM are normal. PERRLA, no scleral icterus. Neck: Normal ROM. Neck supple. No lymphadenopathy, No thyromegaly. CVS: RRR, S1/S2 +, no murmurs, no gallops, no rubs Pulmonary: Effort and breath sounds normal, no stridor, rhonchi, wheezes, rales.   Abdominal: Soft. Normoactive BS,, no distension, tenderness, rebound or guarding.  Musculoskeletal: Normal range of motion. No edema and no tenderness.  Neuro: Alert.Normal muscle tone coordination. Non-focal Skin: Skin is warm and dry. No rash noted. Not diaphoretic. No erythema. No pallor. Psychiatric: Normal mood and affect. Behavior, judgment, thought content normal.  Lab Results  Component Value Date   WBC 6.1 08/31/2015   HGB 13.9 08/31/2015   HCT 43.5 08/31/2015   MCV 93.8 08/31/2015   PLT 243 08/31/2015   Lab Results  Component Value Date   CREATININE 0.70 08/31/2015   BUN 10 08/31/2015   NA 137 08/31/2015   K 3.9 08/31/2015   CL 101 08/31/2015   CO2 28 08/31/2015    Lab Results  Component Value Date   HGBA1C 5.8 (H) 02/10/2014   Lipid Panel     Component Value Date/Time   CHOL 188 08/31/2015 1450   TRIG 89 08/31/2015 1450   HDL 53 08/31/2015 1450   CHOLHDL 3.5 08/31/2015 1450   VLDL 18 08/31/2015 1450   LDLCALC 117 08/31/2015 1450       Assessment and plan:   1. Need for Tdap vaccination  - Tdap vaccine greater than or equal to 7yo IM  2. Screening for diabetes mellitus  - Hemoglobin A1c  3. Essential hypertension  - COMPLETE METABOLIC PANEL WITH GFR - CBC with Differential - TSH  4. Colon cancer screening  - Ambulatory referral to Gastroenterology  5. Chronic systolic heart failure ([facility]) -losartan (COZAAR) 50 MG tablet; Take 1 tablet (50 mg total) by mouth daily.  Dispense: 90 tablet; Refill: 1 -referral to cardiology   6. HTN (hypertension), malignant  - losartan (COZAAR) 50 MG tablet; Take 1 tablet (50 mg total) by mouth daily.  Dispense: 90 tablet; Refill: 1 Continue Lasix and carvedilol   No Follow-up on file.  The patient was given clear instructions to go to ER or return to medical center if symptoms don't improve, worsen or new problems develop. The patient verbalized understanding.    Micheline Chapman FNP  04/24/2016,  9:56 AM

## 2016-04-25 LAB — HEMOGLOBIN A1C
HEMOGLOBIN A1C: 5.3 % (ref <5.7)
Mean Plasma Glucose: 105 mg/dL

## 2016-05-14 ENCOUNTER — Encounter: Payer: Self-pay | Admitting: Gastroenterology

## 2016-07-07 ENCOUNTER — Telehealth: Payer: Self-pay

## 2016-07-07 NOTE — Telephone Encounter (Signed)
Faxed authorization for patient to have an annual eye exam today 07/07/2016 @3 :10pm. Thanks!

## 2016-07-09 ENCOUNTER — Ambulatory Visit (AMBULATORY_SURGERY_CENTER): Payer: Self-pay | Admitting: *Deleted

## 2016-07-09 VITALS — Ht 66.5 in | Wt 225.0 lb

## 2016-07-09 DIAGNOSIS — Z1211 Encounter for screening for malignant neoplasm of colon: Secondary | ICD-10-CM

## 2016-07-09 MED ORDER — NA SULFATE-K SULFATE-MG SULF 17.5-3.13-1.6 GM/177ML PO SOLN
ORAL | 0 refills | Status: DC
Start: 2016-07-09 — End: 2016-07-22

## 2016-07-09 NOTE — Progress Notes (Signed)
Patient denies any allergies to eggs or soy. Patient denies any problems with anesthesia/sedation. Patient denies any oxygen use at home and does not take any diet/weight loss medications. EMMI education declined by patient.  

## 2016-07-22 ENCOUNTER — Encounter: Payer: Self-pay | Admitting: Gastroenterology

## 2016-07-22 ENCOUNTER — Ambulatory Visit (AMBULATORY_SURGERY_CENTER): Payer: Medicaid Other | Admitting: Gastroenterology

## 2016-07-22 VITALS — BP 118/78 | HR 71 | Temp 96.8°F | Resp 16 | Ht 66.0 in | Wt 225.0 lb

## 2016-07-22 DIAGNOSIS — Z1211 Encounter for screening for malignant neoplasm of colon: Secondary | ICD-10-CM | POA: Diagnosis not present

## 2016-07-22 DIAGNOSIS — D123 Benign neoplasm of transverse colon: Secondary | ICD-10-CM

## 2016-07-22 DIAGNOSIS — K621 Rectal polyp: Secondary | ICD-10-CM

## 2016-07-22 DIAGNOSIS — Z1212 Encounter for screening for malignant neoplasm of rectum: Secondary | ICD-10-CM

## 2016-07-22 DIAGNOSIS — D128 Benign neoplasm of rectum: Secondary | ICD-10-CM

## 2016-07-22 DIAGNOSIS — K635 Polyp of colon: Secondary | ICD-10-CM

## 2016-07-22 MED ORDER — SODIUM CHLORIDE 0.9 % IV SOLN: 500.0000 mL | INTRAVENOUS | Status: DC

## 2016-07-22 NOTE — Op Note (Signed)
[facility] Patient Name: Julia [name] Procedure Date: 07/22/2016 8:51 AM MRN: [id number] Endoscopist: Mauri Pole , MD Age: 61 Referring MD:  Date of Birth: 1955/11/09 Gender: Female Account #: [id number] Procedure:                Colonoscopy Indications:              Screening for colorectal malignant neoplasm, This                            is the patient's first colonoscopy Medicines:                Monitored Anesthesia Care Procedure:                Pre-Anesthesia Assessment:                           - Prior to the procedure, a History and Physical                            was performed, and patient medications and                            allergies were reviewed. The patient's tolerance of                            previous anesthesia was also reviewed. The risks                            and benefits of the procedure and the sedation                            options and risks were discussed with the patient.                            All questions were answered, and informed consent                            was obtained. Prior Anticoagulants: The patient has                            taken no previous anticoagulant or antiplatelet                            agents. ASA Grade Assessment: II - A patient with                            mild systemic disease. After reviewing the risks                            and benefits, the patient was deemed in                            satisfactory condition to undergo the procedure.  After obtaining informed consent, the colonoscope                            was passed under direct vision. Throughout the                            procedure, the patient's blood pressure, pulse, and                            oxygen saturations were monitored continuously. The                            Model CF-HQ190L [id number]) scope was introduced                            through the anus and  advanced to the the terminal                            ileum, with identification of the appendiceal                            orifice and IC valve. The colonoscopy was performed                            without difficulty. The patient tolerated the                            procedure well. The quality of the bowel                            preparation was good. The terminal ileum, ileocecal                            valve, appendiceal orifice, and rectum were                            photographed. Scope In: 9:04:38 AM Scope Out: 9:27:18 AM Scope Withdrawal Time: 0 hours 17 minutes 16 seconds  Total Procedure Duration: 0 hours 22 minutes 40 seconds  Findings:                 The perianal and digital rectal examinations were                            normal.                           A 3 mm polyp was found in the transverse colon. The                            polyp was sessile. The polyp was removed with a                            cold snare. Resection and retrieval were complete.  A few small-mouthed diverticula were found in the                            left colon.                           Three sessile polyps were found in the rectum and                            transverse colon. The polyps were 1 to 2 mm in                            size. These polyps were removed with a cold biopsy                            forceps. Resection and retrieval were complete.                           Internal hemorrhoids were found during                            retroflexion. The hemorrhoids were small.                           The exam was otherwise without abnormality. Complications:            No immediate complications. Estimated Blood Loss:     Estimated blood loss: none. Estimated blood loss                            was minimal. Impression:               - One 3 mm polyp in the transverse colon, removed                            with a cold  snare. Resected and retrieved.                           - Diverticulosis in the left colon.                           - Three 1 to 2 mm polyps in the rectum X2 and in                            the transverse colon X1, removed with a cold biopsy                            forceps. Resected and retrieved.                           - Internal hemorrhoids.                           - The examination was otherwise normal. Recommendation:           -  Patient has a contact number available for                            emergencies. The signs and symptoms of potential                            delayed complications were discussed with the                            patient. Return to normal activities tomorrow.                            Written discharge instructions were provided to the                            patient.                           - Resume previous diet.                           - Continue present medications.                           - Await pathology results.                           - Repeat colonoscopy in 5-10 years for surveillance                            based on pathology results.                           - Return to GI clinic PRN.                           -Avoid swallowing chewing gum Mauri Pole, MD 07/22/2016 9:32:43 AM This report has been signed electronically.

## 2016-07-22 NOTE — Patient Instructions (Signed)
YOU HAD AN ENDOSCOPIC PROCEDURE TODAY AT [facility]:   Refer to the procedure report that was given to you for any specific questions about what was found during the examination.  If the procedure report does not answer your questions, please call your gastroenterologist to clarify.  If you requested that your care partner not be given the details of your procedure findings, then the procedure report has been included in a sealed envelope for you to review at your convenience later.  YOU SHOULD EXPECT: Some feelings of bloating in the abdomen. Passage of more gas than usual.  Walking can help get rid of the air that was put into your GI tract during the procedure and reduce the bloating. If you had a lower endoscopy (such as a colonoscopy or flexible sigmoidoscopy) you may notice spotting of blood in your stool or on the toilet paper. If you underwent a bowel prep for your procedure, you may not have a normal bowel movement for a few days.  Please Note:  You might notice some irritation and congestion in your nose or some drainage.  This is from the oxygen used during your procedure.  There is no need for concern and it should clear up in a day or so.  SYMPTOMS TO REPORT IMMEDIATELY:   Following lower endoscopy (colonoscopy or flexible sigmoidoscopy):  Excessive amounts of blood in the stool  Significant tenderness or worsening of abdominal pains  Swelling of the abdomen that is new, acute  Fever of 100F or higher   Following upper endoscopy (EGD)  Vomiting of blood or coffee ground material  New chest pain or pain under the shoulder blades  Painful or persistently difficult swallowing  New shortness of breath  Fever of 100F or higher  Black, tarry-looking stools  For urgent or emergent issues, a gastroenterologist can be reached at any hour by calling [phone].   DIET:  We do recommend a small meal at first, but then you may proceed to your regular diet.  Drink  plenty of fluids but you should avoid alcoholic beverages for 24 hours.  ACTIVITY:  You should plan to take it easy for the rest of today and you should NOT DRIVE or use heavy machinery until tomorrow (because of the sedation medicines used during the test).    FOLLOW UP: Our staff will call the number listed on your records the next business day following your procedure to check on you and address any questions or concerns that you may have regarding the information given to you following your procedure. If we do not reach you, we will leave a message.  However, if you are feeling well and you are not experiencing any problems, there is no need to return our call.  We will assume that you have returned to your regular daily activities without incident.  If any biopsies were taken you will be contacted by phone or by letter within the next 1-3 weeks.  Please call us at [phone] if you have not heard about the biopsies in 3 weeks.    SIGNATURES/CONFIDENTIALITY: You and/or your care partner have signed paperwork which will be entered into your electronic medical record.  These signatures attest to the fact that that the information above on your After Visit Summary has been reviewed and is understood.  Full responsibility of the confidentiality of this discharge information lies with you and/or your care-partner.    Handouts were given to your care partner on polyps,  diverticulosis, and hemorrhoids. You may resume your current medications today. Await biopsy results. Please call if any questions or concerns.   

## 2016-07-22 NOTE — Progress Notes (Signed)
No problems noted in the recovery room. maw 

## 2016-07-22 NOTE — Progress Notes (Signed)
Called to room to assist during endoscopic procedure.  Patient ID and intended procedure confirmed with present staff. Received instructions for my participation in the procedure from the performing physician.  

## 2016-07-22 NOTE — Progress Notes (Signed)
Report to PACU, RN, vss, BBS= Clear.  

## 2016-07-23 ENCOUNTER — Telehealth: Payer: Self-pay

## 2016-07-23 NOTE — Telephone Encounter (Signed)
  Follow up Call-  Call back number 07/22/2016  Post procedure Call Back phone  # [phone]  Permission to leave phone message Yes  Some recent data might be hidden     Patient questions:  Do you have a fever, pain , or abdominal swelling? No. Pain Score  0 *  Have you tolerated food without any problems? Yes.    Have you been able to return to your normal activities? Yes.    Do you have any questions about your discharge instructions: Diet   No. Medications  No. Follow up visit  No.  Do you have questions or concerns about your Care? No.  Actions: * If pain score is 4 or above: No action needed, pain <4.

## 2016-07-25 ENCOUNTER — Encounter: Payer: Self-pay | Admitting: Gastroenterology

## 2016-09-25 ENCOUNTER — Other Ambulatory Visit: Payer: Self-pay | Admitting: Family Medicine

## 2016-09-26 ENCOUNTER — Other Ambulatory Visit: Payer: Self-pay | Admitting: Family Medicine

## 2016-09-26 DIAGNOSIS — Z1231 Encounter for screening mammogram for malignant neoplasm of breast: Secondary | ICD-10-CM

## 2016-10-21 ENCOUNTER — Ambulatory Visit: Payer: Medicaid Other

## 2016-10-23 ENCOUNTER — Encounter: Payer: Self-pay | Admitting: Family Medicine

## 2016-10-23 ENCOUNTER — Ambulatory Visit (INDEPENDENT_AMBULATORY_CARE_PROVIDER_SITE_OTHER): Payer: Medicare Other | Admitting: Family Medicine

## 2016-10-23 VITALS — BP 126/84 | HR 80 | Temp 97.9°F | Resp 16 | Ht 66.0 in | Wt 227.0 lb

## 2016-10-23 DIAGNOSIS — K2100 Gastro-esophageal reflux disease with esophagitis, without bleeding: Secondary | ICD-10-CM

## 2016-10-23 DIAGNOSIS — I447 Left bundle-branch block, unspecified: Secondary | ICD-10-CM | POA: Diagnosis not present

## 2016-10-23 DIAGNOSIS — I10 Essential (primary) hypertension: Secondary | ICD-10-CM

## 2016-10-23 DIAGNOSIS — Z131 Encounter for screening for diabetes mellitus: Secondary | ICD-10-CM | POA: Diagnosis not present

## 2016-10-23 DIAGNOSIS — I509 Heart failure, unspecified: Secondary | ICD-10-CM | POA: Diagnosis not present

## 2016-10-23 DIAGNOSIS — I340 Nonrheumatic mitral (valve) insufficiency: Secondary | ICD-10-CM | POA: Diagnosis not present

## 2016-10-23 LAB — COMPREHENSIVE METABOLIC PANEL
ALBUMIN: 4.2 g/dL (ref 3.6–5.1)
ALK PHOS: 61 U/L (ref 33–130)
ALT: 12 U/L (ref 6–29)
AST: 13 U/L (ref 10–35)
BUN: 11 mg/dL (ref 7–25)
CALCIUM: 9.3 mg/dL (ref 8.6–10.4)
CO2: 21 mmol/L (ref 20–31)
Chloride: 104 mmol/L (ref 98–110)
Creat: 0.79 mg/dL (ref 0.50–0.99)
Glucose, Bld: 113 mg/dL — ABNORMAL HIGH (ref 65–99)
POTASSIUM: 4 mmol/L (ref 3.5–5.3)
Sodium: 138 mmol/L (ref 135–146)
Total Bilirubin: 0.6 mg/dL (ref 0.2–1.2)
Total Protein: 6.9 g/dL (ref 6.1–8.1)

## 2016-10-23 LAB — POCT URINALYSIS DIP (DEVICE)
BILIRUBIN URINE: NEGATIVE
GLUCOSE, UA: NEGATIVE mg/dL
Ketones, ur: NEGATIVE mg/dL
NITRITE: NEGATIVE
PH: 5.5 (ref 5.0–8.0)
Protein, ur: NEGATIVE mg/dL
Specific Gravity, Urine: 1.01 (ref 1.005–1.030)
Urobilinogen, UA: 0.2 mg/dL (ref 0.0–1.0)

## 2016-10-23 LAB — CBC WITH DIFFERENTIAL/PLATELET
BASOS PCT: 0 %
Basophils Absolute: 0 cells/uL (ref 0–200)
EOS PCT: 2 %
Eosinophils Absolute: 96 cells/uL (ref 15–500)
HCT: 44.2 % (ref 35.0–45.0)
Hemoglobin: 14 g/dL (ref 11.7–15.5)
LYMPHS ABS: 1872 {cells}/uL (ref 850–3900)
Lymphocytes Relative: 39 %
MCH: 30 pg (ref 27.0–33.0)
MCHC: 31.7 g/dL — ABNORMAL LOW (ref 32.0–36.0)
MCV: 94.8 fL (ref 80.0–100.0)
MONO ABS: 288 {cells}/uL (ref 200–950)
MPV: 10.5 fL (ref 7.5–12.5)
Monocytes Relative: 6 %
NEUTROS ABS: 2544 {cells}/uL (ref 1500–7800)
Neutrophils Relative %: 53 %
Platelets: 239 10*3/uL (ref 140–400)
RBC: 4.66 MIL/uL (ref 3.80–5.10)
RDW: 13.2 % (ref 11.0–15.0)
WBC: 4.8 10*3/uL (ref 3.8–10.8)

## 2016-10-23 LAB — LIPID PANEL
CHOL/HDL RATIO: 3.9 ratio (ref <5.0)
Cholesterol: 192 mg/dL (ref <200)
HDL: 49 mg/dL — AB (ref >50)
LDL CALC: 122 mg/dL — AB (ref <100)
Triglycerides: 106 mg/dL (ref <150)
VLDL: 21 mg/dL (ref <30)

## 2016-10-23 MED ORDER — OMEPRAZOLE 20 MG PO CPDR
20.0000 mg | DELAYED_RELEASE_CAPSULE | Freq: Every day | ORAL | 3 refills | Status: DC
Start: 2016-10-23 — End: 2017-10-06

## 2016-10-23 NOTE — Patient Instructions (Signed)
Please call me and let me know if your acid reflux symptoms worsen. I have refilled your omeprazole today.  I will get you worked in with another cardiologist.  Return in 6 months for a routine follow-up.     DASH Eating Plan DASH stands for "Dietary Approaches to Stop Hypertension." The DASH eating plan is a healthy eating plan that has been shown to reduce high blood pressure (hypertension). It may also reduce your risk for type 2 diabetes, heart disease, and stroke. The DASH eating plan may also help with weight loss. What are tips for following this plan? General guidelines   Avoid eating more than 2,300 mg (milligrams) of salt (sodium) a day. If you have hypertension, you may need to reduce your sodium intake to 1,500 mg a day.  Limit alcohol intake to no more than 1 drink a day for nonpregnant women and 2 drinks a day for men. One drink equals 12 oz of beer, 5 oz of wine, or 1 oz of hard liquor.  Work with your health care provider to maintain a healthy body weight or to lose weight. Ask what an ideal weight is for you.  Get at least 30 minutes of exercise that causes your heart to beat faster (aerobic exercise) most days of the week. Activities may include walking, swimming, or biking.  Work with your health care provider or diet and nutrition specialist (dietitian) to adjust your eating plan to your individual calorie needs. Reading food labels   Check food labels for the amount of sodium per serving. Choose foods with less than 5 percent of the Daily Value of sodium. Generally, foods with less than 300 mg of sodium per serving fit into this eating plan.  To find whole grains, look for the word "whole" as the first word in the ingredient list. Shopping   Buy products labeled as "low-sodium" or "no salt added."  Buy fresh foods. Avoid canned foods and premade or frozen meals. Cooking   Avoid adding salt when cooking. Use salt-free seasonings or herbs instead of table salt  or sea salt. Check with your health care provider or pharmacist before using salt substitutes.  Do not fry foods. Cook foods using healthy methods such as baking, boiling, grilling, and broiling instead.  Cook with heart-healthy oils, such as olive, canola, soybean, or sunflower oil. Meal planning    Eat a balanced diet that includes:  5 or more servings of fruits and vegetables each day. At each meal, try to fill half of your plate with fruits and vegetables.  Up to 6-8 servings of whole grains each day.  Less than 6 oz of lean meat, poultry, or fish each day. A 3-oz serving of meat is about the same size as a deck of cards. One egg equals 1 oz.  2 servings of low-fat dairy each day.  A serving of nuts, seeds, or beans 5 times each week.  Heart-healthy fats. Healthy fats called Omega-3 fatty acids are found in foods such as flaxseeds and coldwater fish, like sardines, salmon, and mackerel.  Limit how much you eat of the following:  Canned or prepackaged foods.  Food that is high in trans fat, such as fried foods.  Food that is high in saturated fat, such as fatty meat.  Sweets, desserts, sugary drinks, and other foods with added sugar.  Full-fat dairy products.  Do not salt foods before eating.  Try to eat at least 2 vegetarian meals each week.  Eat more home-cooked  food and less restaurant, buffet, and fast food.  When eating at a restaurant, ask that your food be prepared with less salt or no salt, if possible. What foods are recommended? The items listed may not be a complete list. Talk with your dietitian about what dietary choices are best for you. Grains  Whole-grain or whole-wheat bread. Whole-grain or whole-wheat pasta. Brown rice. Modena Morrow. Bulgur. Whole-grain and low-sodium cereals. Pita bread. Low-fat, low-sodium crackers. Whole-wheat flour tortillas. Vegetables  Fresh or frozen vegetables (raw, steamed, roasted, or grilled). Low-sodium or  reduced-sodium tomato and vegetable juice. Low-sodium or reduced-sodium tomato sauce and tomato paste. Low-sodium or reduced-sodium canned vegetables. Fruits  All fresh, dried, or frozen fruit. Canned fruit in natural juice (without added sugar). Meat and other protein foods  Skinless chicken or [place]. Ground chicken or [place]. Pork with fat trimmed off. Fish and seafood. Egg whites. Dried beans, peas, or lentils. Unsalted nuts, nut butters, and seeds. Unsalted canned beans. Lean cuts of beef with fat trimmed off. Low-sodium, lean deli meat. Dairy  Low-fat (1%) or fat-free (skim) milk. Fat-free, low-fat, or reduced-fat cheeses. Nonfat, low-sodium ricotta or cottage cheese. Low-fat or nonfat yogurt. Low-fat, low-sodium cheese. Fats and oils  Soft margarine without trans fats. Vegetable oil. Low-fat, reduced-fat, or light mayonnaise and salad dressings (reduced-sodium). Canola, safflower, olive, soybean, and sunflower oils. Avocado. Seasoning and other foods  Herbs. Spices. Seasoning mixes without salt. Unsalted popcorn and pretzels. Fat-free sweets. What foods are not recommended? The items listed may not be a complete list. Talk with your dietitian about what dietary choices are best for you. Grains  Baked goods made with fat, such as croissants, muffins, or some breads. Dry pasta or rice meal packs. Vegetables  Creamed or fried vegetables. Vegetables in a cheese sauce. Regular canned vegetables (not low-sodium or reduced-sodium). Regular canned tomato sauce and paste (not low-sodium or reduced-sodium). Regular tomato and vegetable juice (not low-sodium or reduced-sodium). Angie Fava. Olives. Fruits  Canned fruit in a light or heavy syrup. Fried fruit. Fruit in cream or butter sauce. Meat and other protein foods  Fatty cuts of meat. Ribs. Fried meat. Berniece Salines. Sausage. Bologna and other processed lunch meats. Salami. Fatback. Hotdogs. Bratwurst. Salted nuts and seeds. Canned beans with added salt.  Canned or smoked fish. Whole eggs or egg yolks. Chicken or [place] with skin. Dairy  Whole or 2% milk, cream, and half-and-half. Whole or full-fat cream cheese. Whole-fat or sweetened yogurt. Full-fat cheese. Nondairy creamers. Whipped toppings. Processed cheese and cheese spreads. Fats and oils  Butter. Stick margarine. Lard. Shortening. Ghee. Bacon fat. Tropical oils, such as coconut, palm kernel, or palm oil. Seasoning and other foods  Salted popcorn and pretzels. Onion salt, garlic salt, seasoned salt, table salt, and sea salt. Worcestershire sauce. Tartar sauce. Barbecue sauce. Teriyaki sauce. Soy sauce, including reduced-sodium. Steak sauce. Canned and packaged gravies. Fish sauce. Oyster sauce. Cocktail sauce. Horseradish that you find on the shelf. Ketchup. Mustard. Meat flavorings and tenderizers. Bouillon cubes. Hot sauce and Tabasco sauce. Premade or packaged marinades. Premade or packaged taco seasonings. Relishes. Regular salad dressings. Where to find more information:  National Heart, Lung, and [place]: https://wilson-eaton.com/  American Heart Association: www.heart.org Summary  The DASH eating plan is a healthy eating plan that has been shown to reduce high blood pressure (hypertension). It may also reduce your risk for type 2 diabetes, heart disease, and stroke.  With the DASH eating plan, you should limit salt (sodium) intake to 2,300 mg a day. If  you have hypertension, you may need to reduce your sodium intake to 1,500 mg a day.  When on the DASH eating plan, aim to eat more fresh fruits and vegetables, whole grains, lean proteins, low-fat dairy, and heart-healthy fats.  Work with your health care provider or diet and nutrition specialist (dietitian) to adjust your eating plan to your individual calorie needs. This information is not intended to replace advice given to you by your health care provider. Make sure you discuss any questions you have with your health care  provider. Document Released: 06/05/2011 Document Revised: 06/09/2016 Document Reviewed: 06/09/2016 Elsevier Interactive Patient Education  2017 [employer].  Exercising to [employer] Exercising regularly is important. It has many health benefits, such as:  Improving your overall fitness, flexibility, and endurance.  Increasing your bone density.  Helping with weight control.  Decreasing your body fat.  Increasing your muscle strength.  Reducing stress and tension.  Improving your overall health. In order to become healthy and stay healthy, it is recommended that you do moderate-intensity and vigorous-intensity exercise. You can tell that you are exercising at a moderate intensity if you have a higher heart rate and faster breathing, but you are still able to hold a conversation. You can tell that you are exercising at a vigorous intensity if you are breathing much harder and faster and cannot hold a conversation while exercising. How often should I exercise? Choose an activity that you enjoy and set realistic goals. Your health care provider can help you to make an activity plan that works for you. Exercise regularly as directed by your health care provider. This may include:  Doing resistance training twice each week, such as:  Push-ups.  Sit-ups.  Lifting weights.  Using resistance bands.  Doing a given intensity of exercise for a given amount of time. Choose from these options:  150 minutes of moderate-intensity exercise every week.  75 minutes of vigorous-intensity exercise every week.  A mix of moderate-intensity and vigorous-intensity exercise every week. Children, pregnant women, people who are out of shape, people who are overweight, and older adults may need to consult a health care provider for individual recommendations. If you have any sort of medical condition, be sure to consult your health care provider before starting a new exercise program. What are some  exercise ideas? Some moderate-intensity exercise ideas include:  Walking at a rate of 1 mile in 15 minutes.  Biking.  Hiking.  Golfing.  Dancing. Some vigorous-intensity exercise ideas include:  Walking at a rate of at least 4.5 miles per hour.  Jogging or running at a rate of 5 miles per hour.  Biking at a rate of at least 10 miles per hour.  Lap swimming.  Roller-skating or in-line skating.  Cross-country skiing.  Vigorous competitive sports, such as football, basketball, and soccer.  Jumping rope.  Aerobic dancing. What are some everyday activities that can help me to get exercise?  Yard work, such as:  Psychologist, educational.  Raking and bagging leaves.  Washing and waxing your car.  Pushing a stroller.  Shoveling snow.  Gardening.  Washing windows or floors. How can I be more active in my day-to-day activities?  Use the stairs instead of the elevator.  Take a walk during your lunch break.  If you drive, park your car farther away from work or school.  If you take public transportation, get off one stop early and walk the rest of the way.  Make all of your  phone calls while standing up and walking around.  Get up, stretch, and walk around every 30 minutes throughout the day. What guidelines should I follow while exercising?  Do not exercise so much that you hurt yourself, feel dizzy, or get very short of breath.  Consult your health care provider before starting a new exercise program.  Wear comfortable clothes and shoes with good support.  Drink plenty of water while you exercise to prevent dehydration or heat stroke. Body water is lost during exercise and must be replaced.  Work out until you breathe faster and your heart beats faster. This information is not intended to replace advice given to you by your health care provider. Make sure you discuss any questions you have with your health care provider. Document Released: 07/19/2010  Document Revised: 11/22/2015 Document Reviewed: 11/17/2013 Elsevier Interactive Patient Education  2017 [employer].

## 2016-10-23 NOTE — Progress Notes (Signed)
Patient ID: Julia [name], female    DOB: [birth year], 61 y.o.   MRN: [id number]  PCP: Molli Barrows, FNP  Chief Complaint  Patient presents with  . Follow-up    6 MONTH    Subjective:  HPI  Julia [name] is a 61 y.o. female presents for routine 6 month follow-up. Medical History: Hypertension, Cardiomyopathy, EF 35%, LBBB, and Mitral Regurgitation  Psalm presents today for a routine medical follow-up. Reports that she feels great. Her main goal in life right now its to lose weight and incorporate a healthier life-style. She recently began walking 30 minutes and goal is to exercise daily as soon as the weather regulates. She quit smoking 4 years ago and attributes weight gain initially to smoking cessation. She is also not eating after 6:00 pm but reports she continues to experience acid reflux if she doesn't take omeprazole. She is incorporating more vegetables and smaller portions of meat. Maintains a low sodium with no added table salt diet.  [facility] evaluation and reports that she is due to follow-up. She had requested from her prior provider to be referred to a different cardiologist. She reports no personal issues with her prior cardiologist although she would feel more comfortable with a different provider, a female if possible.  Denies any chest pain, headaches, has shortness of breath with climbing stairs or with increased physical activity. Denies lower extremity swelling or unintentional weigh gain.   Social History   Social History  . Marital status: Single    Spouse name: N/A  . Number of children: N/A  . Years of education: N/A   Occupational History  . Not on file.   Social History Main Topics  . Smoking status: Former Smoker    Packs/day: 0.50    Types: Cigarettes    Quit date: 02/15/2014  . Smokeless tobacco: Never Used  . Alcohol use No  . Drug use: No  . Sexual activity: Yes   Other Topics Concern  . Not  on file   Social History Narrative  . No narrative on file    Family History  Problem Relation Age of Onset  . Diabetes Mother   . Hypertension Mother   . Diabetes Father   . Heart disease Father   . Diabetes Sister   . Drug abuse Sister   . Hypertension Sister   . Mental illness Sister   . Stroke Sister   . Miscarriages / Stillbirths Sister   . Alcohol abuse Brother   . Cancer Brother   . Alcohol abuse Maternal Aunt   . Alcohol abuse Maternal Uncle   . Alcohol abuse Paternal Uncle   . Cancer Paternal Uncle   . Colon cancer Neg Hx   . Colon polyps Neg Hx    Review of Systems See HPI   Patient Active Problem List   Diagnosis Date Noted  . History of congestive cardiomyopathy 02/12/2014  . Hyperkalemia 02/12/2014  . Normal coronary arteries 02/12/2014  . Mitral regurgitation   . Essential hypertension, malignant 02/08/2014  . LBBB (left bundle branch block) 02/08/2014  . UTERINE FIBROID 08/27/2006    No Known Allergies  Prior to Admission medications   Medication Sig Start Date End Date Taking? Authorizing Provider  carvedilol (COREG) 6.25 MG tablet TAKE ONE & ONE-HALF TABLETS BY MOUTH TWICE DAILY WITH MEALS 04/24/16  Yes Micheline Chapman, NP  furosemide (LASIX) 40 MG tablet TAKE ONE-HALF TABLET BY MOUTH ONCE  DAILY 09/25/16  Yes Sedalia Muta, FNP  losartan (COZAAR) 50 MG tablet Take 1 tablet (50 mg total) by mouth daily. 04/24/16  Yes Micheline Chapman, NP  omeprazole (PRILOSEC) 20 MG capsule Take 1 capsule (20 mg total) by mouth daily. 04/24/16  Yes Micheline Chapman, NP  spironolactone (ALDACTONE) 25 MG tablet Take 1 tablet (25 mg total) by mouth daily. 04/24/16  Yes Micheline Chapman, NP    Past Medical, Surgical Family and Social History reviewed and updated.    Objective:   Today's Vitals   10/23/16 1026  BP: 126/84  Pulse: 80  Resp: 16  Temp: 97.9 F (36.6 C)  TempSrc: Oral  SpO2: 98%  Weight: 227 lb (103 kg)  Height: 5\' 6"  (1.676  m)    Wt Readings from Last 3 Encounters:  10/23/16 227 lb (103 kg)  07/22/16 225 lb (102.1 kg)  07/09/16 225 lb (102.1 kg)    Physical Exam  Constitutional: She is oriented to person, place, and time. She appears well-developed and well-nourished.  HENT:  Head: Normocephalic and atraumatic.  Right Ear: External ear normal.  Left Ear: External ear normal.  Mouth/Throat: Oropharynx is clear and moist.  Eyes: Conjunctivae are normal. Pupils are equal, round, and reactive to light.  Neck: Normal range of motion. Neck supple.  Cardiovascular: Normal rate, regular rhythm, normal heart sounds and intact distal pulses.   Pulmonary/Chest: Effort normal and breath sounds normal.  Neurological: She is alert and oriented to person, place, and time.  Skin: Skin is warm and dry.  Psychiatric: She has a normal mood and affect. Her behavior is normal. Judgment and thought content normal.      Assessment & Plan:  1. Gastroesophageal reflux disease with esophagitis -continue omeprazole. If symptoms worsen, notify me to consider increase dose or trying a different acid suppressant   2. Congestive heart failure, unspecified HF chronicity, unspecified heart failure type ([place]) - Ambulatory referral to Cardiology -Continue Furosemide 20 mg once daily   3. LBBB (left bundle branch block) -Ambulatory referral to Cardiology  4. Essential hypertension, Controlled  -Continue spirolactone and Coreg  5. Mitral valve insufficiency, unspecified etiology - Ambulatory referral to Cardiology  6. Screening for diabetes mellitus - Hemoglobin A1c  RTC: 6 months   Carroll Sage. Kenton Kingfisher, MSN, [facility] [address] [place], [place] [address] [phone]

## 2016-10-24 LAB — THYROID PANEL WITH TSH
FREE THYROXINE INDEX: 2.2 (ref 1.4–3.8)
T3 Uptake: 31 % (ref 22–35)
T4 TOTAL: 7 ug/dL (ref 4.5–12.0)
TSH: 1.41 m[IU]/L

## 2016-10-24 LAB — HEMOGLOBIN A1C
HEMOGLOBIN A1C: 5.4 % (ref <5.7)
Mean Plasma Glucose: 108 mg/dL

## 2016-10-26 MED ORDER — FUROSEMIDE 40 MG PO TABS: 20.0000 mg | ORAL_TABLET | Freq: Every day | ORAL | 2 refills | Status: DC

## 2016-10-29 ENCOUNTER — Ambulatory Visit: Payer: Medicaid Other | Admitting: Cardiovascular Disease

## 2016-11-05 ENCOUNTER — Ambulatory Visit
Admission: RE | Admit: 2016-11-05 | Discharge: 2016-11-05 | Disposition: A | Discharge status: Home / Self Care | Payer: Medicaid Other | Source: Ambulatory Visit | Attending: Family Medicine | Admitting: Family Medicine

## 2016-11-05 DIAGNOSIS — Z1231 Encounter for screening mammogram for malignant neoplasm of breast: Secondary | ICD-10-CM

## 2016-11-13 ENCOUNTER — Ambulatory Visit (INDEPENDENT_AMBULATORY_CARE_PROVIDER_SITE_OTHER): Payer: Medicare Other | Admitting: Cardiovascular Disease

## 2016-11-13 ENCOUNTER — Encounter: Payer: Self-pay | Admitting: Cardiovascular Disease

## 2016-11-13 DIAGNOSIS — E876 Hypokalemia: Secondary | ICD-10-CM

## 2016-11-13 DIAGNOSIS — I5022 Chronic systolic (congestive) heart failure: Secondary | ICD-10-CM

## 2016-11-13 DIAGNOSIS — I10 Essential (primary) hypertension: Secondary | ICD-10-CM

## 2016-11-13 MED ORDER — LOSARTAN POTASSIUM 50 MG PO TABS: 50.0000 mg | ORAL_TABLET | Freq: Every day | ORAL | 3 refills | Status: DC

## 2016-11-13 MED ORDER — CARVEDILOL 6.25 MG PO TABS: ORAL_TABLET | ORAL | 3 refills | Status: DC

## 2016-11-13 MED ORDER — SPIRONOLACTONE 25 MG PO TABS: 25.0000 mg | ORAL_TABLET | Freq: Every day | ORAL | 3 refills | Status: DC

## 2016-11-13 NOTE — Progress Notes (Signed)
[birth year] Julia [name]   [birth year]  [id number]  Primary Physician Scot Jun, FNP Primary Cardiologist: Lorretta Harp MD Renae Gloss  HPI:  Ms. Julia [name] is a pleasant 61 year old mild to moderately overweight single African-American female mother of 2 children, grandmother of 38 grandchildren and has worked as a Glass blower/designer in the past and currently is on disability because of her cardiomyopathy. Her primary care provider is Dr. Liston Alba. She has seen Dr. Debara Pickett in the past. Her cardiovascular risk factors include 10-pack-years of tobacco having quit 3 years ago and treated hypertension. She's never had a heart attack or stroke. She denies chest pain but does get some dyspnea on exertion. She had a cardiac catheterization remotely that has shown normal coronary arteries and LV dysfunction and EF of 35% at that time which ultimately improved to 50% on medications. Since she was seen by Dr. Debara Pickett 06/18/15 she does complain of some dyspnea on exertion but denies chest pain. She is compliant with her medications and her diet.   Current Outpatient Prescriptions  Medication Sig Dispense Refill  . carvedilol (COREG) 6.25 MG tablet TAKE ONE & ONE-HALF TABLETS BY MOUTH TWICE DAILY WITH MEALS 90 tablet 1  . furosemide (LASIX) 40 MG tablet Take 0.5 tablets (20 mg total) by mouth daily. 90 tablet 2  . losartan (COZAAR) 50 MG tablet Take 1 tablet (50 mg total) by mouth daily. 90 tablet 1  . omeprazole (PRILOSEC) 20 MG capsule Take 1 capsule (20 mg total) by mouth daily. 30 capsule 3  . spironolactone (ALDACTONE) 25 MG tablet Take 1 tablet (25 mg total) by mouth daily. 90 tablet 1   Current Facility-Administered Medications  Medication Dose Route Frequency Provider Last Rate Last Dose  . 0.9 %  sodium chloride infusion  500 mL Intravenous Continuous Nandigam, Venia Minks, MD        No Known Allergies  Social History   Social History  . Marital status: Single    Spouse name: N/A  . Number of children: N/A  . Years of education: N/A   Occupational History  . Not on file.   Social History Main Topics  . Smoking status: Former Smoker    Packs/day: 0.50    Types: Cigarettes    Quit date: 02/15/2014  . Smokeless tobacco: Never Used  . Alcohol use No  . Drug use: No  . Sexual activity: Yes   Other Topics Concern  . Not on file   Social History Narrative  . No narrative on file     Review of Systems: General: negative for chills, fever, night sweats or weight changes.  Cardiovascular: negative for chest pain, dyspnea on exertion, edema, orthopnea, palpitations, paroxysmal nocturnal dyspnea or shortness of breath Dermatological: negative for rash Respiratory: negative for cough or wheezing Urologic: negative for hematuria Abdominal: negative for nausea, vomiting, diarrhea, bright red blood per rectum, melena, or hematemesis Neurologic: negative for visual changes, syncope, or dizziness All other systems reviewed and are otherwise negative except as noted above.    Blood pressure 122/64, pulse 62, height 5\' 6"  (1.676 m), weight 224 lb (101.6 kg), SpO2 95 %.  General appearance: alert and no distress Neck: no adenopathy, no carotid bruit, no JVD, supple, symmetrical, trachea midline and thyroid not enlarged, symmetric, no tenderness/mass/nodules Lungs: clear to auscultation bilaterally Heart: regular rate and rhythm, S1, S2 normal, no murmur, click, rub or gallop Extremities: extremities normal, atraumatic, no cyanosis or edema  EKG normal  sinus rhythm at 63 with left bundle branch block, chronic. I personally reviewed this EKG.  ASSESSMENT AND PLAN:   Essential hypertension, malignant History of essential hypertension blood pressure measured 120/64. She is on losartan, carvedilol and spironolactone. Continue current meds at current dosing  Mitral regurgitation History of moderate MR by 2-D echo performed 05/15/14. That time her EF  was 50%. We will repeat a 2-D echocardiogram.  History of congestive cardiomyopathy History of nonischemic cardiomyopathy with cardiac catheterization documented normal coronary arteries initially with an EF of 35% which also improved to 50% by 2-D echo 11/15. She does complain of some dyspnea on exertion. We will recheck a 2-D echocardiogram.      Lorretta Harp MD [facility], [facility] 11/13/2016 4:18 PM

## 2016-11-13 NOTE — Assessment & Plan Note (Signed)
History of nonischemic cardiomyopathy with cardiac catheterization documented normal coronary arteries initially with an EF of 35% which also improved to 50% by 2-D echo 11/15. She does complain of some dyspnea on exertion. We will recheck a 2-D echocardiogram.

## 2016-11-13 NOTE — Assessment & Plan Note (Signed)
History of essential hypertension blood pressure measured 120/64. She is on losartan, carvedilol and spironolactone. Continue current meds at current dosing

## 2016-11-13 NOTE — Patient Instructions (Signed)
Medication was refilled No changes at present time     Schedule at [facility] has requested that you have an echocardiogram. Echocardiography is a painless test that uses sound waves to create images of your heart. It provides your doctor with information about the size and shape of your heart and how well your heart's chambers and valves are working. This procedure takes approximately one hour. There are no restrictions for this procedure.     Your physician wants you to follow-up in 12 months with DR BERRY.You will receive a reminder letter in the mail two months in advance. If you don't receive a letter, please call our office to schedule the follow-up appointment.    If you need a refill on your cardiac medications before your next appointment, please call your pharmacy.

## 2016-11-13 NOTE — Assessment & Plan Note (Signed)
History of moderate MR by 2-D echo performed 05/15/14. That time her EF was 50%. We will repeat a 2-D echocardiogram.

## 2016-11-14 ENCOUNTER — Ambulatory Visit: Payer: Medicaid Other | Admitting: Cardiovascular Disease

## 2016-11-26 ENCOUNTER — Other Ambulatory Visit (HOSPITAL_COMMUNITY): Payer: Medicaid Other

## 2016-12-04 ENCOUNTER — Encounter (INDEPENDENT_AMBULATORY_CARE_PROVIDER_SITE_OTHER): Payer: Self-pay

## 2016-12-04 ENCOUNTER — Other Ambulatory Visit: Payer: Self-pay

## 2016-12-04 ENCOUNTER — Ambulatory Visit (HOSPITAL_COMMUNITY): Payer: Medicare Other | Attending: Cardiology

## 2016-12-04 DIAGNOSIS — I050 Rheumatic mitral stenosis: Secondary | ICD-10-CM | POA: Insufficient documentation

## 2016-12-04 DIAGNOSIS — I083 Combined rheumatic disorders of mitral, aortic and tricuspid valves: Secondary | ICD-10-CM | POA: Insufficient documentation

## 2016-12-04 DIAGNOSIS — I5022 Chronic systolic (congestive) heart failure: Secondary | ICD-10-CM | POA: Diagnosis not present

## 2016-12-04 DIAGNOSIS — Z87891 Personal history of nicotine dependence: Secondary | ICD-10-CM | POA: Insufficient documentation

## 2016-12-04 DIAGNOSIS — I10 Essential (primary) hypertension: Secondary | ICD-10-CM | POA: Diagnosis not present

## 2016-12-04 DIAGNOSIS — I110 Hypertensive heart disease with heart failure: Secondary | ICD-10-CM | POA: Insufficient documentation

## 2016-12-09 ENCOUNTER — Other Ambulatory Visit: Payer: Self-pay

## 2016-12-09 DIAGNOSIS — I5022 Chronic systolic (congestive) heart failure: Secondary | ICD-10-CM

## 2017-03-06 ENCOUNTER — Ambulatory Visit (INDEPENDENT_AMBULATORY_CARE_PROVIDER_SITE_OTHER): Payer: Medicare Other | Admitting: Family Medicine

## 2017-03-06 ENCOUNTER — Encounter: Payer: Self-pay | Admitting: Family Medicine

## 2017-03-06 VITALS — BP 140/80 | HR 64 | Temp 98.1°F | Resp 16 | Ht 66.0 in | Wt 222.0 lb

## 2017-03-06 DIAGNOSIS — J069 Acute upper respiratory infection, unspecified: Secondary | ICD-10-CM

## 2017-03-06 MED ORDER — IPRATROPIUM BROMIDE 0.03 % NA SOLN
2.0000 | Freq: Two times a day (BID) | NASAL | 0 refills | Status: DC
Start: 2017-03-06 — End: 2020-12-13

## 2017-03-06 NOTE — Progress Notes (Signed)
Patient ID: Minus Breeding, female    DOB: Apr 25, 1956, 61 y.o.   MRN: [id number]  PCP: Scot Jun, FNP  Chief Complaint  Patient presents with  . Nasal Congestion    Subjective:  HPI Julia [name] is a 61 y.o. female presents for evaluation of nasal congestion x 4 days. Reports initially experiencing sneezing and post nasal drainage. Over the last two days she has developed a cough accompanied by chest congestion. Reports no associated fever, facial tenderness, headache, or throat soreness. She reports associated sputum production which is clear. She has attempted relief with Zyrtec10 mg and Coricidin with only minimal relief of symptoms.  Social History   Social History  . Marital status: Single    Spouse name: N/A  . Number of children: N/A  . Years of education: N/A   Occupational History  . Not on file.   Social History Main Topics  . Smoking status: Former Smoker    Packs/day: 0.50    Types: Cigarettes    Quit date: 02/15/2014  . Smokeless tobacco: Never Used  . Alcohol use No  . Drug use: No  . Sexual activity: Yes   Other Topics Concern  . Not on file   Social History Narrative  . No narrative on file    Family History  Problem Relation Age of Onset  . Diabetes Mother   . Hypertension Mother   . Diabetes Father   . Heart disease Father   . Diabetes Sister   . Drug abuse Sister   . Hypertension Sister   . Mental illness Sister   . Stroke Sister   . Miscarriages / Stillbirths Sister   . Alcohol abuse Brother   . Cancer Brother   . Alcohol abuse Maternal Aunt   . Alcohol abuse Maternal Uncle   . Alcohol abuse Paternal Uncle   . Cancer Paternal Uncle   . Colon cancer Neg Hx   . Colon polyps Neg Hx    Review of Systems See HPI  Patient Active Problem List   Diagnosis Date Noted  . History of congestive cardiomyopathy 02/12/2014  . Hyperkalemia 02/12/2014  . Normal coronary arteries 02/12/2014  . Mitral regurgitation   . Essential  hypertension, malignant 02/08/2014  . LBBB (left bundle branch block) 02/08/2014  . UTERINE FIBROID 08/27/2006    No Known Allergies  Prior to Admission medications   Medication Sig Start Date End Date Taking? Authorizing Provider  carvedilol (COREG) 6.25 MG tablet TAKE ONE & ONE-HALF TABLETS BY MOUTH TWICE DAILY WITH MEALS 11/13/16  Yes Lorretta Harp, MD  furosemide (LASIX) 40 MG tablet Take 0.5 tablets (20 mg total) by mouth daily. 10/26/16  Yes Scot Jun, FNP  losartan (COZAAR) 50 MG tablet Take 1 tablet (50 mg total) by mouth daily. 11/13/16  Yes Lorretta Harp, MD  omeprazole (PRILOSEC) 20 MG capsule Take 1 capsule (20 mg total) by mouth daily. 10/23/16  Yes Scot Jun, FNP  spironolactone (ALDACTONE) 25 MG tablet Take 1 tablet (25 mg total) by mouth daily. 11/13/16  Yes Lorretta Harp, MD    Past Medical, Surgical Family and Social History reviewed and updated.    Objective:   Today's Vitals   03/06/17 0844  BP: 140/80  Pulse: 64  Resp: 16  Temp: 98.1 F (36.7 C)  TempSrc: Oral  SpO2: 97%  Weight: 222 lb (100.7 kg)  Height: 5\' 6"  (1.676 m)    Wt Readings from Last 3 Encounters:  03/06/17 222 lb (100.7 kg)  11/13/16 224 lb (101.6 kg)  10/23/16 227 lb (103 kg)   Physical Exam  Constitutional: She is oriented to person, place, and time. She appears well-developed and well-nourished.  HENT:  Head: Normocephalic and atraumatic.  Right Ear: External ear normal.  Left Ear: External ear normal.  Nose: Mucosal edema and rhinorrhea present. Right sinus exhibits no maxillary sinus tenderness and no frontal sinus tenderness. Left sinus exhibits no maxillary sinus tenderness and no frontal sinus tenderness.  Mouth/Throat: Oropharynx is clear and moist. No oropharyngeal exudate, posterior oropharyngeal edema or posterior oropharyngeal erythema.  Eyes: Pupils are equal, round, and reactive to light. Conjunctivae are normal.  Cardiovascular: Normal rate,  regular rhythm, normal heart sounds and intact distal pulses.   Pulmonary/Chest: Effort normal and breath sounds normal.  Musculoskeletal: Normal range of motion.  Neurological: She is alert and oriented to person, place, and time.  Skin: Skin is warm and dry.  Psychiatric: She has a normal mood and affect. Her behavior is normal. Judgment and thought content normal.   Assessment & Plan:  1. Upper respiratory tract infection, unspecified type -Continue conservative treatment, if no improvement, I will provide treatment with antibiotic therapy. Continue cetirizine and adding ipratropium nasal spray. Administer as directed.     Meds ordered this encounter  Medications  . ipratropium (ATROVENT) 0.03 % nasal spray    Sig: Place 2 sprays into both nostrils 2 (two) times daily.    Dispense:  30 mL    Refill:  0    Order Specific Question:   Supervising Provider    Answer:   Tresa Garter [[id number]]    -If symptoms continue return for care.   Carroll Sage. Kenton Kingfisher, MSN, FNP-C [facility]  [address] [place], [place] [address] [phone]

## 2017-03-06 NOTE — Patient Instructions (Signed)
Cetirizine 10 mg daily and adding Atrovent 2 sprays in morning and 2 sprays bedtime.      Allergic Rhinitis Allergic rhinitis is when the mucous membranes in the nose respond to allergens. Allergens are particles in the air that cause your body to have an allergic reaction. This causes you to release allergic antibodies. Through a chain of events, these eventually cause you to release histamine into the blood stream. Although meant to protect the body, it is this release of histamine that causes your discomfort, such as frequent sneezing, congestion, and an itchy, runny nose. What are the causes? Seasonal allergic rhinitis (hay fever) is caused by pollen allergens that may come from grasses, trees, and weeds. Year-round allergic rhinitis (perennial allergic rhinitis) is caused by allergens such as house dust mites, pet dander, and mold spores. What are the signs or symptoms?  Nasal stuffiness (congestion).  Itchy, runny nose with sneezing and tearing of the eyes. How is this diagnosed? Your health care provider can help you determine the allergen or allergens that trigger your symptoms. If you and your health care provider are unable to determine the allergen, skin or blood testing may be used. Your health care provider will diagnose your condition after taking your health history and performing a physical exam. Your health care provider may assess you for other related conditions, such as asthma, pink eye, or an ear infection. How is this treated? Allergic rhinitis does not have a cure, but it can be controlled by:  Medicines that block allergy symptoms. These may include allergy shots, nasal sprays, and oral antihistamines.  Avoiding the allergen.  Hay fever may often be treated with antihistamines in pill or nasal spray forms. Antihistamines block the effects of histamine. There are over-the-counter medicines that may help with nasal congestion and swelling around the eyes. Check with your  health care provider before taking or giving this medicine. If avoiding the allergen or the medicine prescribed do not work, there are many new medicines your health care provider can prescribe. Stronger medicine may be used if initial measures are ineffective. Desensitizing injections can be used if medicine and avoidance does not work. Desensitization is when a patient is given ongoing shots until the body becomes less sensitive to the allergen. Make sure you follow up with your health care provider if problems continue. Follow these instructions at home: It is not possible to completely avoid allergens, but you can reduce your symptoms by taking steps to limit your exposure to them. It helps to know exactly what you are allergic to so that you can avoid your specific triggers. Contact a health care provider if:  You have a fever.  You develop a cough that does not stop easily (persistent).  You have shortness of breath.  You start wheezing.  Symptoms interfere with normal daily activities. This information is not intended to replace advice given to you by your health care provider. Make sure you discuss any questions you have with your health care provider. Document Released: 03/11/2001 Document Revised: 02/15/2016 Document Reviewed: 02/21/2013 Elsevier Interactive Patient Education  2017 [employer].

## 2017-04-24 ENCOUNTER — Ambulatory Visit: Payer: Medicaid Other | Admitting: Family Medicine

## 2017-05-18 ENCOUNTER — Other Ambulatory Visit: Payer: Self-pay | Admitting: Cardiovascular Disease

## 2017-10-06 ENCOUNTER — Encounter: Payer: Self-pay | Admitting: Family Medicine

## 2017-10-06 ENCOUNTER — Ambulatory Visit (INDEPENDENT_AMBULATORY_CARE_PROVIDER_SITE_OTHER): Payer: Medicare Other | Admitting: Family Medicine

## 2017-10-06 VITALS — BP 118/62 | HR 78 | Temp 98.0°F | Ht 66.0 in | Wt 219.0 lb

## 2017-10-06 DIAGNOSIS — I509 Heart failure, unspecified: Secondary | ICD-10-CM

## 2017-10-06 DIAGNOSIS — Z130 Encounter for screening for diseases of the blood and blood-forming organs and certain disorders involving the immune mechanism: Secondary | ICD-10-CM | POA: Diagnosis not present

## 2017-10-06 DIAGNOSIS — R05 Cough: Secondary | ICD-10-CM

## 2017-10-06 DIAGNOSIS — I10 Essential (primary) hypertension: Secondary | ICD-10-CM | POA: Diagnosis not present

## 2017-10-06 DIAGNOSIS — R7303 Prediabetes: Secondary | ICD-10-CM

## 2017-10-06 DIAGNOSIS — Z13220 Encounter for screening for lipoid disorders: Secondary | ICD-10-CM

## 2017-10-06 DIAGNOSIS — R040 Epistaxis: Secondary | ICD-10-CM

## 2017-10-06 DIAGNOSIS — R059 Cough, unspecified: Secondary | ICD-10-CM

## 2017-10-06 LAB — POCT GLYCOSYLATED HEMOGLOBIN (HGB A1C): HEMOGLOBIN A1C: 5.7

## 2017-10-06 LAB — POCT URINALYSIS DIP (MANUAL ENTRY)
BILIRUBIN UA: NEGATIVE
BILIRUBIN UA: NEGATIVE mg/dL
GLUCOSE UA: NEGATIVE mg/dL
NITRITE UA: NEGATIVE
Protein Ur, POC: NEGATIVE mg/dL
Spec Grav, UA: 1.015 (ref 1.010–1.025)
Urobilinogen, UA: 0.2 E.U./dL
pH, UA: 6 (ref 5.0–8.0)

## 2017-10-06 MED ORDER — OMEPRAZOLE 20 MG PO CPDR: 20.0000 mg | DELAYED_RELEASE_CAPSULE | Freq: Every day | ORAL | 2 refills | Status: DC

## 2017-10-06 NOTE — Progress Notes (Signed)
Patient ID: Julia [name], female    DOB: 10-16-1955, 62 y.o.   MRN: [id number]  PCP: Scot Jun, FNP  Chief Complaint  Patient presents with  . Cough    URI    Subjective:  HPI Julia [name] is a 62 y.o. female presents for evaluation of an ongoing cough following resolution of a recent upper respiratory illness. Medical problems include Hypertension, Cardiomyopathy, EF 35%, LBBB, and Mitral Regurgitation. Reports during recent travels from the beach she developed nasal congestions, fatigue, cough, scratchy throat, and mild headache. All symptoms resolved with the exception of non-productive cough which occurs at night. Cough doesn't interfere with sleep. She is mostly concerned as she wants to rule out any chest congestion. She reports experiencing mild nose bleed a few days ago, however this has not reoccurred. Denies fever, shortness of breath, wheezing, any new headache, sputum production, body aches or fatigue. She is prescribed cetrizine for allergies although doesn't take consistently daily. Request an A1C check today as she has made lifestyle changes since her diagnosis of prediabetes. Overall she reports "feeling well" . Next cardiology appointment scheduled for the fall. Denies any other complaints today. Social History   Socioeconomic History  . Marital status: Single    Spouse name: Not on file  . Number of children: Not on file  . Years of education: Not on file  . Highest education level: Not on file  Occupational History  . Not on file  Social Needs  . Financial resource strain: Not on file  . Food insecurity:    Worry: Not on file    Inability: Not on file  . Transportation needs:    Medical: Not on file    Non-medical: Not on file  Tobacco Use  . Smoking status: Former Smoker    Packs/day: 0.50    Types: Cigarettes    Last attempt to quit: 02/15/2014    Years since quitting: 3.6  . Smokeless tobacco: Never Used  Substance and Sexual Activity  .  Alcohol use: No    Alcohol/week: 0.0 oz  . Drug use: No  . Sexual activity: Yes  Lifestyle  . Physical activity:    Days per week: Not on file    Minutes per session: Not on file  . Stress: Not on file  Relationships  . Social connections:    Talks on phone: Not on file    Gets together: Not on file    Attends religious service: Not on file    Active member of club or organization: Not on file    Attends meetings of clubs or organizations: Not on file    Relationship status: Not on file  . Intimate partner violence:    Fear of current or ex partner: Not on file    Emotionally abused: Not on file    Physically abused: Not on file    Forced sexual activity: Not on file  Other Topics Concern  . Not on file  Social History Narrative  . Not on file    Family History  Problem Relation Age of Onset  . Diabetes Mother   . Hypertension Mother   . Diabetes Father   . Heart disease Father   . Diabetes Sister   . Drug abuse Sister   . Hypertension Sister   . Mental illness Sister   . Stroke Sister   . Miscarriages / Stillbirths Sister   . Alcohol abuse Brother   . Cancer Brother   .  Alcohol abuse Maternal Aunt   . Alcohol abuse Maternal Uncle   . Alcohol abuse Paternal Uncle   . Cancer Paternal Uncle   . Colon cancer Neg Hx   . Colon polyps Neg Hx    Review of Systems  Pertinent negatives listed in HPI  Patient Active Problem List   Diagnosis Date Noted  . History of congestive cardiomyopathy 02/12/2014  . Hyperkalemia 02/12/2014  . Normal coronary arteries 02/12/2014  . Mitral regurgitation   . Essential hypertension, malignant 02/08/2014  . LBBB (left bundle branch block) 02/08/2014  . UTERINE FIBROID 08/27/2006    No Known Allergies  Prior to Admission medications   Medication Sig Start Date End Date Taking? Authorizing Provider  carvedilol (COREG) 6.25 MG tablet TAKE 1 & 1/2 (ONE & ONE-HALF) TABLETS BY MOUTH TWICE DAILY WITH MEALS 05/18/17  Yes Lorretta Harp, MD  furosemide (LASIX) 40 MG tablet Take 0.5 tablets (20 mg total) by mouth daily. 10/26/16  Yes Scot Jun, FNP  ipratropium (ATROVENT) 0.03 % nasal spray Place 2 sprays into both nostrils 2 (two) times daily. 03/06/17  Yes Scot Jun, FNP  losartan (COZAAR) 50 MG tablet Take 1 tablet (50 mg total) by mouth daily. 11/13/16  Yes Lorretta Harp, MD  omeprazole (PRILOSEC) 20 MG capsule Take 1 capsule (20 mg total) by mouth daily. 10/23/16  Yes Scot Jun, FNP  spironolactone (ALDACTONE) 25 MG tablet Take 1 tablet (25 mg total) by mouth daily. 11/13/16  Yes Lorretta Harp, MD    Past Medical, Surgical Family and Social History reviewed and updated.    Objective:   Today's Vitals   10/06/17 0948  BP: 118/62  Pulse: 78  Temp: 98 F (36.7 C)  TempSrc: Oral  SpO2: 99%  Weight: 219 lb (99.3 kg)  Height: 5\' 6"  (1.676 m)    Wt Readings from Last 3 Encounters:  10/06/17 219 lb (99.3 kg)  03/06/17 222 lb (100.7 kg)  11/13/16 224 lb (101.6 kg)   Physical Exam  Constitutional: She is oriented to person, place, and time. She appears well-developed and well-nourished.  HENT:  Head: Normocephalic and atraumatic.  Right Ear: External ear normal.  Left Ear: External ear normal.  Mouth/Throat: Oropharynx is clear and moist.  Bilateral nasal mucosal dry  Eyes: Pupils are equal, round, and reactive to light. Conjunctivae are normal.  Neck: Normal range of motion. Neck supple.  Cardiovascular: Normal rate, regular rhythm, normal heart sounds and intact distal pulses.  Pulmonary/Chest: Effort normal and breath sounds normal.  Musculoskeletal: Normal range of motion.  Lymphadenopathy:    She has no cervical adenopathy.  Neurological: She is alert and oriented to person, place, and time.  Skin: Skin is warm and dry.  Psychiatric: She has a normal mood and affect. Her behavior is normal. Judgment and thought content normal.   Assessment & Plan:  1.  Prediabetes, A1C 5.7. No changes. Encouraged physical activity, goal 150 minutes per week or as tolerated. Portion control. Increase proteins and vegetables. CMP Pending future  2. Epistaxis, resolved  3. Cough, patient declined anti-tussive medication will trial OTC delsym if symptoms persists.  4. Congestive heart failure, unspecified HF chronicity, unspecified heart failure type ([place]), asymptomatic. -Limit fluid intake to no more than 1500 ml per day. -If you experiencing greater than 3 lb weight gain within 24 hours, notify our clinic or if after hours report to the Emergency Department. -It is important to keep your blood pressure under control.  Monitor blood pressure at home and if you obtain readings greater than 150/90 consecutively over a period of 3 days, notify me here at the office. -Avoid adding table salt or eating food such as can soup or frozen meals which are high in sodium. This increases fluid retention and is likely to worsen your heart failure.  5. Hypertension, unspecified type, well controlled. No recent TSH panel on file. Thyroid Panel With TSH, pending future.  6. Screening for deficiency anemia, CBC pending Future.  7. Screening for hyperlipidemia, fasting lipid panel pending.   Patient will return to complete all labs. Refilled omeprazole for chronic GERD.  Meds ordered this encounter  Medications  . omeprazole (PRILOSEC) 20 MG capsule    Sig: Take 1 capsule (20 mg total) by mouth daily.    Dispense:  90 capsule    Refill:  2    Order Specific Question:   Supervising Provider    Answer:   Tresa Garter [[id number]]    RTC: for fasting labs and CPE 1 year    Carroll Sage. Kenton Kingfisher, MSN, FNP-C [facility]  [address] [place], [place] [address] [phone]

## 2017-10-06 NOTE — Patient Instructions (Addendum)
I recommend over the counter oceans nasal spray to hydrate nares.  If cough worsens, please follow-up here in office.   Prediabetes Eating Plan Prediabetes-also called impaired glucose tolerance or impaired fasting glucose-is a condition that causes blood sugar (blood glucose) levels to be higher than normal. Following a healthy diet can help to keep prediabetes under control. It can also help to lower the risk of type 2 diabetes and heart disease, which are increased in people who have prediabetes. Along with regular exercise, a healthy diet:  Promotes weight loss.  Helps to control blood sugar levels.  Helps to improve the way that the body uses insulin.  What do I need to know about this eating plan?  Use the glycemic index (GI) to plan your meals. The index tells you how quickly a food will raise your blood sugar. Choose low-GI foods. These foods take a longer time to raise blood sugar.  Pay close attention to the amount of carbohydrates in the food that you eat. Carbohydrates increase blood sugar levels.  Keep track of how many calories you take in. Eating the right amount of calories will help you to achieve a healthy weight. Losing about 7 percent of your starting weight can help to prevent type 2 diabetes.  You may want to follow a Mediterranean diet. This diet includes a lot of vegetables, lean meats or fish, whole grains, fruits, and healthy oils and fats. What foods can I eat? Grains Whole grains, such as whole-wheat or whole-grain breads, crackers, cereals, and pasta. Unsweetened oatmeal. Bulgur. Barley. Quinoa. Brown rice. Corn or whole-wheat flour tortillas or taco shells. Vegetables Lettuce. Spinach. Peas. Beets. Cauliflower. Cabbage. Broccoli. Carrots. Tomatoes. Squash. Eggplant. Herbs. Peppers. Onions. Cucumbers. Brussels sprouts. Fruits Berries. Bananas. Apples. Oranges. Grapes. Papaya. Mango. Pomegranate. Kiwi. Grapefruit. Cherries. Meats and Other Protein  Sources Seafood. Lean meats, such as chicken and [place] or lean cuts of pork and beef. Tofu. Eggs. Nuts. Beans. Dairy Low-fat or fat-free dairy products, such as yogurt, cottage cheese, and cheese. Beverages Water. Tea. Coffee. Sugar-free or diet soda. Seltzer water. Milk. Milk alternatives, such as soy or almond milk. Condiments Mustard. Relish. Low-fat, low-sugar ketchup. Low-fat, low-sugar barbecue sauce. Low-fat or fat-free mayonnaise. Sweets and Desserts Sugar-free or low-fat pudding. Sugar-free or low-fat ice cream and other frozen treats. Fats and Oils Avocado. Walnuts. Olive oil. The items listed above may not be a complete list of recommended foods or beverages. Contact your dietitian for more options. What foods are not recommended? Grains Refined white flour and flour products, such as bread, pasta, snack foods, and cereals. Beverages Sweetened drinks, such as sweet iced tea and soda. Sweets and Desserts Baked goods, such as cake, cupcakes, pastries, cookies, and cheesecake. The items listed above may not be a complete list of foods and beverages to avoid. Contact your dietitian for more information. This information is not intended to replace advice given to you by your health care provider. Make sure you discuss any questions you have with your health care provider. Document Released: 10/31/2014 Document Revised: 11/22/2015 Document Reviewed: 07/12/2014 Elsevier Interactive Patient Education  2017 [employer].

## 2017-11-02 ENCOUNTER — Other Ambulatory Visit: Payer: Medicare Other

## 2017-11-02 DIAGNOSIS — Z13220 Encounter for screening for lipoid disorders: Secondary | ICD-10-CM | POA: Diagnosis not present

## 2017-11-02 DIAGNOSIS — R7303 Prediabetes: Secondary | ICD-10-CM

## 2017-11-02 DIAGNOSIS — I10 Essential (primary) hypertension: Secondary | ICD-10-CM

## 2017-11-03 ENCOUNTER — Other Ambulatory Visit: Payer: Medicare Other

## 2017-11-03 LAB — LIPID PANEL
CHOLESTEROL TOTAL: 193 mg/dL (ref 100–199)
Chol/HDL Ratio: 3.9 ratio (ref 0.0–4.4)
HDL: 50 mg/dL (ref >39)
LDL CALC: 123 mg/dL — AB (ref 0–99)
TRIGLYCERIDES: 98 mg/dL (ref 0–149)
VLDL Cholesterol Cal: 20 mg/dL (ref 5–40)

## 2017-11-03 LAB — CBC WITH DIFFERENTIAL/PLATELET
BASOS ABS: 0 10*3/uL (ref 0.0–0.2)
Basos: 0 %
EOS (ABSOLUTE): 0.1 10*3/uL (ref 0.0–0.4)
EOS: 1 %
HEMATOCRIT: 42.1 % (ref 34.0–46.6)
HEMOGLOBIN: 13.6 g/dL (ref 11.1–15.9)
Immature Grans (Abs): 0 10*3/uL (ref 0.0–0.1)
Immature Granulocytes: 0 %
LYMPHS ABS: 2 10*3/uL (ref 0.7–3.1)
Lymphs: 39 %
MCH: 30.5 pg (ref 26.6–33.0)
MCHC: 32.3 g/dL (ref 31.5–35.7)
MCV: 94 fL (ref 79–97)
MONOCYTES: 7 %
Monocytes Absolute: 0.4 10*3/uL (ref 0.1–0.9)
NEUTROS ABS: 2.8 10*3/uL (ref 1.4–7.0)
NEUTROS PCT: 53 %
Platelets: 256 10*3/uL (ref 150–379)
RBC: 4.46 x10E6/uL (ref 3.77–5.28)
RDW: 13.6 % (ref 12.3–15.4)
WBC: 5.2 10*3/uL (ref 3.4–10.8)

## 2017-11-03 LAB — COMPREHENSIVE METABOLIC PANEL
A/G RATIO: 1.6 (ref 1.2–2.2)
ALT: 19 IU/L (ref 0–32)
AST: 20 IU/L (ref 0–40)
Albumin: 4.3 g/dL (ref 3.6–4.8)
Alkaline Phosphatase: 69 IU/L (ref 39–117)
BUN/Creatinine Ratio: 14 (ref 12–28)
BUN: 10 mg/dL (ref 8–27)
Bilirubin Total: 0.4 mg/dL (ref 0.0–1.2)
CALCIUM: 9.5 mg/dL (ref 8.7–10.3)
CO2: 21 mmol/L (ref 20–29)
Chloride: 104 mmol/L (ref 96–106)
Creatinine, Ser: 0.74 mg/dL (ref 0.57–1.00)
GFR calc Af Amer: 100 mL/min/{1.73_m2} (ref >59)
GFR, EST NON AFRICAN AMERICAN: 87 mL/min/{1.73_m2} (ref >59)
GLOBULIN, TOTAL: 2.7 g/dL (ref 1.5–4.5)
Glucose: 117 mg/dL — ABNORMAL HIGH (ref 65–99)
POTASSIUM: 4 mmol/L (ref 3.5–5.2)
SODIUM: 140 mmol/L (ref 134–144)
Total Protein: 7 g/dL (ref 6.0–8.5)

## 2017-11-03 LAB — THYROID PANEL WITH TSH
Free Thyroxine Index: 1.4 (ref 1.2–4.9)
T3 Uptake Ratio: 26 % (ref 24–39)
T4 TOTAL: 5.5 ug/dL (ref 4.5–12.0)
TSH: 1.56 u[IU]/mL (ref 0.450–4.500)

## 2017-11-05 ENCOUNTER — Encounter: Payer: Self-pay | Admitting: Family Medicine

## 2017-11-05 ENCOUNTER — Other Ambulatory Visit: Payer: Self-pay | Admitting: Cardiovascular Disease

## 2017-11-05 NOTE — Progress Notes (Signed)
Please mail lab letter  

## 2017-11-05 NOTE — Telephone Encounter (Signed)
Rx request sent to pharmacy.  

## 2017-11-05 NOTE — Progress Notes (Signed)
erroneous

## 2017-12-10 ENCOUNTER — Encounter (INDEPENDENT_AMBULATORY_CARE_PROVIDER_SITE_OTHER): Payer: Self-pay

## 2017-12-10 ENCOUNTER — Ambulatory Visit (HOSPITAL_COMMUNITY): Payer: Medicare Other | Attending: Cardiology

## 2017-12-10 ENCOUNTER — Other Ambulatory Visit: Payer: Self-pay

## 2017-12-10 DIAGNOSIS — I5022 Chronic systolic (congestive) heart failure: Secondary | ICD-10-CM | POA: Insufficient documentation

## 2017-12-10 DIAGNOSIS — I110 Hypertensive heart disease with heart failure: Secondary | ICD-10-CM | POA: Diagnosis not present

## 2017-12-10 DIAGNOSIS — I083 Combined rheumatic disorders of mitral, aortic and tricuspid valves: Secondary | ICD-10-CM | POA: Diagnosis not present

## 2017-12-10 DIAGNOSIS — Z87891 Personal history of nicotine dependence: Secondary | ICD-10-CM | POA: Diagnosis not present

## 2017-12-23 ENCOUNTER — Encounter: Payer: Self-pay | Admitting: Cardiovascular Disease

## 2017-12-23 ENCOUNTER — Ambulatory Visit (INDEPENDENT_AMBULATORY_CARE_PROVIDER_SITE_OTHER): Payer: Medicare Other | Admitting: Cardiovascular Disease

## 2017-12-23 VITALS — BP 118/78 | HR 65 | Ht 66.5 in | Wt 225.0 lb

## 2017-12-23 DIAGNOSIS — I447 Left bundle-branch block, unspecified: Secondary | ICD-10-CM

## 2017-12-23 DIAGNOSIS — Z8679 Personal history of other diseases of the circulatory system: Secondary | ICD-10-CM

## 2017-12-23 DIAGNOSIS — E7800 Pure hypercholesterolemia, unspecified: Secondary | ICD-10-CM

## 2017-12-23 NOTE — Progress Notes (Signed)
9/83/3825 LACRISHA BIELICKI   [id number]  [id number]  Primary Physician Scot Jun, FNP Primary Cardiologist: Lorretta Harp MD FACP, [place], [place], [place]  HPI:  Julia [name] is a 62 y.o.  mild to moderately overweight single African-American female mother of 2 children, grandmother of 60 grandchildren and has worked as a Glass blower/designer in the past and currently is on disability because of her cardiomyopathy.  I last saw her in the office 11/13/2016.  Her primary care provider is Dr. Liston Alba. She has seen Dr. Debara Pickett in the past. Her cardiovascular risk factors include 10-pack-years of tobacco having quit 3 years ago and treated hypertension. She's never had a heart attack or stroke. She denies chest pain but does get some dyspnea on exertion. She had a cardiac catheterization remotely that has shown normal coronary arteries and LV dysfunction and EF of 35% at that time which ultimately improved to 50% on medications. Since she was seen by Dr. Debara Pickett 06/18/15 she does complain of some dyspnea on exertion but denies chest pain. She is compliant with her medications and her diet. Since I saw her a year ago she is done well.  She gets occasional shortness of breath.  Her most recent 2D echo performed 12/10/2017 was entirely normal.     Current Meds  Medication Sig  . carvedilol (COREG) 6.25 MG tablet TAKE 1 & 1/2 (ONE & ONE-HALF) TABLETS BY MOUTH TWICE DAILY WITH MEALS  . furosemide (LASIX) 40 MG tablet Take 0.5 tablets (20 mg total) by mouth daily.  Marland Kitchen ipratropium (ATROVENT) 0.03 % nasal spray Place 2 sprays into both nostrils 2 (two) times daily.  Marland Kitchen losartan (COZAAR) 50 MG tablet Take 1 tablet (50 mg total) by mouth daily.  Marland Kitchen omeprazole (PRILOSEC) 20 MG capsule Take 1 capsule (20 mg total) by mouth daily.  Marland Kitchen spironolactone (ALDACTONE) 25 MG tablet Take 1 tablet (25 mg total) by mouth daily.   Current Facility-Administered Medications for the 12/23/17 encounter (Office  Visit) with Lorretta Harp, MD  Medication  . 0.9 %  sodium chloride infusion     No Known Allergies  Social History   Socioeconomic History  . Marital status: Single    Spouse name: Not on file  . Number of children: Not on file  . Years of education: Not on file  . Highest education level: Not on file  Occupational History  . Not on file  Social Needs  . Financial resource strain: Not on file  . Food insecurity:    Worry: Not on file    Inability: Not on file  . Transportation needs:    Medical: Not on file    Non-medical: Not on file  Tobacco Use  . Smoking status: Former Smoker    Packs/day: 0.50    Types: Cigarettes    Last attempt to quit: 02/15/2014    Years since quitting: 3.8  . Smokeless tobacco: Never Used  Substance and Sexual Activity  . Alcohol use: No    Alcohol/week: 0.0 oz  . Drug use: No  . Sexual activity: Yes  Lifestyle  . Physical activity:    Days per week: Not on file    Minutes per session: Not on file  . Stress: Not on file  Relationships  . Social connections:    Talks on phone: Not on file    Gets together: Not on file    Attends religious service: Not on file    Active member  of club or organization: Not on file    Attends meetings of clubs or organizations: Not on file    Relationship status: Not on file  . Intimate partner violence:    Fear of current or ex partner: Not on file    Emotionally abused: Not on file    Physically abused: Not on file    Forced sexual activity: Not on file  Other Topics Concern  . Not on file  Social History Narrative  . Not on file     Review of Systems: General: negative for chills, fever, night sweats or weight changes.  Cardiovascular: negative for chest pain, dyspnea on exertion, edema, orthopnea, palpitations, paroxysmal nocturnal dyspnea or shortness of breath Dermatological: negative for rash Respiratory: negative for cough or wheezing Urologic: negative for hematuria Abdominal:  negative for nausea, vomiting, diarrhea, bright red blood per rectum, melena, or hematemesis Neurologic: negative for visual changes, syncope, or dizziness All other systems reviewed and are otherwise negative except as noted above.    Blood pressure 118/78, pulse 65, height 5' 6.5" (1.689 m), weight 225 lb (102.1 kg).  General appearance: alert and no distress Neck: no adenopathy, no carotid bruit, no JVD, supple, symmetrical, trachea midline and thyroid not enlarged, symmetric, no tenderness/mass/nodules Lungs: clear to auscultation bilaterally Heart: regular rate and rhythm, S1, S2 normal, no murmur, click, rub or gallop Extremities: extremities normal, atraumatic, no cyanosis or edema Pulses: 2+ and symmetric Skin: Skin color, texture, turgor normal. No rashes or lesions Neurologic: Alert and oriented X 3, normal strength and tone. Normal symmetric reflexes. Normal coordination and gait  EKG sinus rhythm at 65 with left bundle branch block which is chronic.  I personally reviewed this EKG.  ASSESSMENT AND PLAN:   LBBB (left bundle branch block) Chronic  Essential hypertension, malignant History of essential hypertension with blood pressure measured at 118/78.  She is on carvedilol and losartan as well as spironolactone.  Continue current meds at current dosing.  History of congestive cardiomyopathy History of nonischemic cardiomyopathy with normal coronary arteries a cath and an initial EF of 35% which has since normalized.  Most recent echo performed 12/10/2017 revealed an EF of 60 to 65% with mild MR.  She is asymptomatic.      Lorretta Harp MD FACP,FACC,FAHA, [facility] 12/23/2017 11:49 AM

## 2017-12-23 NOTE — Assessment & Plan Note (Signed)
History of essential hypertension with blood pressure measured at 118/78.  She is on carvedilol and losartan as well as spironolactone.  Continue current meds at current dosing.

## 2017-12-23 NOTE — Assessment & Plan Note (Signed)
Chronic. 

## 2017-12-23 NOTE — Patient Instructions (Signed)
Medication Instructions:   NO CHANGE  Labwork:  Your physician recommends that you return for lab work in: [place]:  Your physician wants you to follow-up in: [place] will receive a reminder letter in the mail two months in advance. If you don't receive a letter, please call our office to schedule the follow-up appointment.   If you need a refill on your cardiac medications before your next appointment, please call your pharmacy.

## 2017-12-23 NOTE — Assessment & Plan Note (Signed)
History of nonischemic cardiomyopathy with normal coronary arteries a cath and an initial EF of 35% which has since normalized.  Most recent echo performed 12/10/2017 revealed an EF of 60 to 65% with mild MR.  She is asymptomatic.

## 2017-12-28 ENCOUNTER — Other Ambulatory Visit: Payer: Self-pay | Admitting: Cardiovascular Disease

## 2017-12-28 DIAGNOSIS — I10 Essential (primary) hypertension: Secondary | ICD-10-CM

## 2017-12-28 DIAGNOSIS — I5022 Chronic systolic (congestive) heart failure: Secondary | ICD-10-CM

## 2017-12-28 DIAGNOSIS — E876 Hypokalemia: Secondary | ICD-10-CM

## 2018-01-14 ENCOUNTER — Other Ambulatory Visit: Payer: Self-pay | Admitting: Family Medicine

## 2018-03-25 ENCOUNTER — Encounter: Payer: Self-pay | Admitting: *Deleted

## 2018-04-08 ENCOUNTER — Other Ambulatory Visit: Payer: Self-pay | Admitting: Cardiovascular Disease

## 2018-07-29 ENCOUNTER — Other Ambulatory Visit: Payer: Self-pay | Admitting: Cardiovascular Disease

## 2018-07-30 NOTE — Telephone Encounter (Signed)
Rx(s) sent to pharmacy electronically.  

## 2018-08-11 DIAGNOSIS — H5203 Hypermetropia, bilateral: Secondary | ICD-10-CM | POA: Diagnosis not present

## 2018-08-11 DIAGNOSIS — H52209 Unspecified astigmatism, unspecified eye: Secondary | ICD-10-CM | POA: Diagnosis not present

## 2018-08-11 DIAGNOSIS — H524 Presbyopia: Secondary | ICD-10-CM | POA: Diagnosis not present

## 2018-10-15 ENCOUNTER — Other Ambulatory Visit: Payer: Self-pay | Admitting: Cardiovascular Disease

## 2018-10-15 ENCOUNTER — Other Ambulatory Visit: Payer: Self-pay | Admitting: Family Medicine

## 2018-10-18 NOTE — Telephone Encounter (Signed)
Carvedilol 6.25 mg refilled. 

## 2018-10-23 ENCOUNTER — Other Ambulatory Visit: Payer: Self-pay | Admitting: Family Medicine

## 2018-10-23 NOTE — Telephone Encounter (Signed)
Please advise 

## 2018-10-26 ENCOUNTER — Telehealth: Payer: Self-pay

## 2018-10-26 MED ORDER — OMEPRAZOLE 20 MG PO CPDR: 20.0000 mg | DELAYED_RELEASE_CAPSULE | Freq: Every day | ORAL | 0 refills | Status: DC

## 2018-10-26 NOTE — Telephone Encounter (Signed)
-----   Message from Azzie Glatter, [place] sent at 10/25/2018 12:45 PM EDT ----- Regarding: "Refill Request" Patient is requesting refill on Prilosec. I have never seen this patient. Please inform her to schedule follow up appointment with me asap. After she schedules appointment, we can give her a 30-day refill. Thank you.

## 2018-10-26 NOTE — Telephone Encounter (Signed)
Patient has schedule appointment and a 30 day supply of Omeprazole has been sent to pharmacy

## 2018-11-03 ENCOUNTER — Ambulatory Visit (INDEPENDENT_AMBULATORY_CARE_PROVIDER_SITE_OTHER): Payer: Medicare HMO | Admitting: Family Medicine

## 2018-11-03 ENCOUNTER — Encounter: Payer: Self-pay | Admitting: Family Medicine

## 2018-11-03 ENCOUNTER — Other Ambulatory Visit: Payer: Self-pay

## 2018-11-03 VITALS — BP 140/66 | HR 70 | Temp 97.9°F | Ht 66.5 in | Wt 225.0 lb

## 2018-11-03 DIAGNOSIS — R0602 Shortness of breath: Secondary | ICD-10-CM | POA: Diagnosis not present

## 2018-11-03 DIAGNOSIS — N390 Urinary tract infection, site not specified: Secondary | ICD-10-CM | POA: Diagnosis not present

## 2018-11-03 DIAGNOSIS — Z0000 Encounter for general adult medical examination without abnormal findings: Secondary | ICD-10-CM | POA: Diagnosis not present

## 2018-11-03 DIAGNOSIS — Z7689 Persons encountering health services in other specified circumstances: Secondary | ICD-10-CM | POA: Diagnosis not present

## 2018-11-03 DIAGNOSIS — R319 Hematuria, unspecified: Secondary | ICD-10-CM | POA: Diagnosis not present

## 2018-11-03 DIAGNOSIS — Z09 Encounter for follow-up examination after completed treatment for conditions other than malignant neoplasm: Secondary | ICD-10-CM

## 2018-11-03 DIAGNOSIS — Z131 Encounter for screening for diabetes mellitus: Secondary | ICD-10-CM | POA: Diagnosis not present

## 2018-11-03 DIAGNOSIS — I10 Essential (primary) hypertension: Secondary | ICD-10-CM | POA: Diagnosis not present

## 2018-11-03 LAB — POCT URINALYSIS DIP (MANUAL ENTRY)
Bilirubin, UA: NEGATIVE
Glucose, UA: NEGATIVE mg/dL
Ketones, POC UA: NEGATIVE mg/dL
Nitrite, UA: NEGATIVE
Protein Ur, POC: NEGATIVE mg/dL
Spec Grav, UA: 1.02 (ref 1.010–1.025)
Urobilinogen, UA: 1 E.U./dL
pH, UA: 7 (ref 5.0–8.0)

## 2018-11-03 LAB — POCT GLYCOSYLATED HEMOGLOBIN (HGB A1C): Hemoglobin A1C: 5.7 % — AB (ref 4.0–5.6)

## 2018-11-03 MED ORDER — SULFAMETHOXAZOLE-TRIMETHOPRIM 800-160 MG PO TABS: 1.0000 | ORAL_TABLET | Freq: Two times a day (BID) | ORAL | 0 refills | Status: DC

## 2018-11-03 NOTE — Progress Notes (Signed)
Patient [name]   Re-Establish Care  Subjective:  Patient ID: Julia [name], female    DOB: 08/22/1955  Age: 63 y.o. MRN: [id number]  CC:  Chief Complaint  Patient presents with  . Establish Care    HPI Julia [name] is a 63 year old female who presents for Follow Up today.   Past Medical History:  Diagnosis Date  . Allergy   . Anxiety   . CHF (congestive heart failure) ([place])   . GERD (gastroesophageal reflux disease)   . Hypertension   . Mitral regurgitation    a. severe by ECHO  01/2014  . Systolic CHF ([place])    a. 2D ECHO: 02/09/2014; EF 35-30%, mild LV dilation, severe, diffuse hypokinesis, regional WMAs cannot be excluded. Ventricular septum with mild dyssynergy c/w LBBB, mild AR, severe MR, severely dilated LA.  . Tobacco abuse    Current Status: Since her last office visit, she is doing well with no complaints. She denies visual changes, chest pain, cough, shortness of breath, heart palpitations, and falls. She has occasional headaches and dizziness with position changes. Denies severe headaches, confusion, seizures, double vision, and blurred vision, nausea and vomiting. She recently had Optometry follow up will need new glasses. Her anxiety is mild today. She denies suicidal ideations, homicidal ideations, or auditory hallucinations.   She denies fevers, chills, fatigue, recent infections, weight loss, and night sweats. No reports of GI problems such as nausea, vomiting, diarrhea, and constipation. She has no reports of blood in stools, dysuria and hematuria. She denies pain today.   Past Surgical History:  Procedure Laterality Date  . ABDOMINAL HYSTERECTOMY     partial  . CARDIAC CATHETERIZATION  01/2014   Normal coronaries. EF of 35 to 40%  . DIAGNOSTIC LAPAROSCOPY     to remove fibrioid tumor  . LEFT HEART CATHETERIZATION WITH CORONARY ANGIOGRAM N/A 02/10/2014   Procedure: LEFT HEART CATHETERIZATION WITH CORONARY  ANGIOGRAM;  Surgeon: Sinclair Grooms, MD;  [facility] C[facility] LAB;  Service: Cardiovascular;  Laterality: N/A;  . MOUTH SURGERY N/A 2017  . TUBAL LIGATION      Family History  Problem Relation Age of Onset  . Diabetes Mother   . Hypertension Mother   . Diabetes Father   . Heart disease Father   . Diabetes Sister   . Drug abuse Sister   . Hypertension Sister   . Mental illness Sister   . Stroke Sister   . Miscarriages / Stillbirths Sister   . Alcohol abuse Brother   . Cancer Brother   . Alcohol abuse Maternal Aunt   . Alcohol abuse Maternal Uncle   . Alcohol abuse Paternal Uncle   . Cancer Paternal Uncle   . Colon cancer Neg Hx   . Colon polyps Neg Hx     Social History   Socioeconomic History  . Marital status: Single    Spouse name: Not on file  . Number of children: Not on file  . Years of education: Not on file  . Highest education level: Not on file  Occupational History  . Not on file  Social Needs  . Financial resource strain: Not on file  . Food insecurity:    Worry: Not on file    Inability: Not on file  . Transportation needs:    Medical: Not on file    Non-medical: Not on file  Tobacco Use  . Smoking status: Former Smoker  Packs/day: 0.50    Types: Cigarettes    Last attempt to quit: 02/15/2014    Years since quitting: 4.7  . Smokeless tobacco: Never Used  Substance and Sexual Activity  . Alcohol use: No    Alcohol/week: 0.0 standard drinks  . Drug use: No  . Sexual activity: Yes  Lifestyle  . Physical activity:    Days per week: Not on file    Minutes per session: Not on file  . Stress: Not on file  Relationships  . Social connections:    Talks on phone: Not on file    Gets together: Not on file    Attends religious service: Not on file    Active member of club or organization: Not on file    Attends meetings of clubs or organizations: Not on file    Relationship status: Not on file  . Intimate partner violence:    Fear of current or  ex partner: Not on file    Emotionally abused: Not on file    Physically abused: Not on file    Forced sexual activity: Not on file  Other Topics Concern  . Not on file  Social History Narrative  . Not on file    Outpatient Medications Prior to Visit  Medication Sig Dispense Refill  . carvedilol (COREG) 6.25 MG tablet TAKE 1 & 1/2 (ONE & ONE-HALF) TABLETS BY MOUTH TWICE DAILY WITH A MEAL 90 tablet 0  . furosemide (LASIX) 40 MG tablet TAKE 1/2 (ONE-HALF) TABLET BY MOUTH ONCE DAILY 45 tablet 5  . ipratropium (ATROVENT) 0.03 % nasal spray Place 2 sprays into both nostrils 2 (two) times daily. 30 mL 0  . losartan (COZAAR) 50 MG tablet TAKE 1 TABLET BY MOUTH ONCE DAILY 90 tablet 3  . omeprazole (PRILOSEC) 20 MG capsule Take 1 capsule (20 mg total) by mouth daily. 30 capsule 0  . spironolactone (ALDACTONE) 25 MG tablet TAKE 1 TABLET BY MOUTH ONCE DAILY 90 tablet 3   Facility-Administered Medications Prior to Visit  Medication Dose Route Frequency Provider Last Rate Last Dose  . 0.9 %  sodium chloride infusion  500 mL Intravenous Continuous Nandigam, Kavitha V, MD        No Known Allergies  ROS Review of Systems  Constitutional: Negative.   HENT: Negative.   Eyes: Negative.   Respiratory: Negative.   Cardiovascular: Negative.   Gastrointestinal: Negative.   Endocrine: Negative.   Genitourinary: Negative.   Musculoskeletal: Negative.   Skin: Negative.   Allergic/Immunologic: Negative.   Neurological: Positive for dizziness and headaches.  Hematological: Negative.   Psychiatric/Behavioral: Negative.       Objective:    Physical Exam  Constitutional: She is oriented to person, place, and time. She appears well-developed and well-nourished.  HENT:  Head: Normocephalic and atraumatic.  Eyes: Conjunctivae are normal.  Neck: Normal range of motion. Neck supple.  Cardiovascular: Normal rate, regular rhythm, normal heart sounds and intact distal pulses.  Pulmonary/Chest: Effort  normal and breath sounds normal.  Abdominal: Soft. Bowel sounds are normal.  Musculoskeletal: Normal range of motion.  Neurological: She is alert and oriented to person, place, and time. She has normal reflexes.  Skin: Skin is warm and dry.  Psychiatric: She has a normal mood and affect. Her behavior is normal. Judgment and thought content normal.  Nursing note and vitals reviewed.   BP 140/66 (BP Location: Right Arm, Patient Position: Sitting, Cuff Size: Large)   Pulse 70   Temp 97.9 F (36.6  C) (Oral)   Ht 5' 6.5" (1.689 m)   Wt 225 lb (102.1 kg)   SpO2 100%   BMI 35.77 kg/m  Wt Readings from Last 3 Encounters:  11/03/18 225 lb (102.1 kg)  12/23/17 225 lb (102.1 kg)  10/06/17 219 lb (99.3 kg)     Health Maintenance Due  Topic Date Due  . PAP SMEAR-Modifier  09/20/1976    There are no preventive care reminders to display for this patient.  Lab Results  Component Value Date   TSH 1.560 11/02/2017   Lab Results  Component Value Date   WBC 5.2 11/02/2017   HGB 13.6 11/02/2017   HCT 42.1 11/02/2017   MCV 94 11/02/2017   PLT 256 11/02/2017   Lab Results  Component Value Date   NA 140 11/02/2017   K 4.0 11/02/2017   CO2 21 11/02/2017   GLUCOSE 117 (H) 11/02/2017   BUN 10 11/02/2017   CREATININE 0.74 11/02/2017   BILITOT 0.4 11/02/2017   ALKPHOS 69 11/02/2017   AST 20 11/02/2017   ALT 19 11/02/2017   PROT 7.0 11/02/2017   ALBUMIN 4.3 11/02/2017   CALCIUM 9.5 11/02/2017   ANIONGAP 15 02/12/2014   GFR 97.38 03/28/2014   Lab Results  Component Value Date   CHOL 193 11/02/2017   Lab Results  Component Value Date   HDL 50 11/02/2017   Lab Results  Component Value Date   LDLCALC 123 (H) 11/02/2017   Lab Results  Component Value Date   TRIG 98 11/02/2017   Lab Results  Component Value Date   CHOLHDL 3.9 11/02/2017   Lab Results  Component Value Date   HGBA1C 5.7 (A) 11/03/2018    Assessment & Plan:   1. Establishing care with new doctor,  encounter for  2. Hypertension, unspecified type Blood pressure is stable at 140/66 today. She will continue to decrease high sodium intake, excessive alcohol intake, increase potassium intake, smoking cessation, and increase physical activity of at least 30 minutes of cardio activity daily. She will continue to follow Heart Healthy or DASH diet.  3. Shortness of breath Stable today.   4. Screening for diabetes mellitus Hgb A1c is stable at 5.7 today.  She will continue to decrease foods/beverages high in sugars and carbs and follow Heart Healthy or DASH diet. Increase physical activity to at least 30 minutes cardio exercise daily.  - POCT glycosylated hemoglobin (Hb A1C) - POCT urinalysis dipstick  5. Urinary tract infection with hematuria, site unspecified We will initiate Septra today.  - sulfamethoxazole-trimethoprim (BACTRIM DS) 800-160 MG tablet; Take 1 tablet by mouth 2 (two) times daily.  Dispense: 14 tablet; Refill: 0  6. Healthcare maintenance - CBC with Differential - Comprehensive metabolic panel - Lipid Panel - TSH - Vitamin D, 25-hydroxy - Vitamin B12  7. Follow up She will follow up in 6 months. Disability sticker given to patient today.   Meds ordered this encounter  Medications  . sulfamethoxazole-trimethoprim (BACTRIM DS) 800-160 MG tablet    Sig: Take 1 tablet by mouth 2 (two) times daily.    Dispense:  14 tablet    Refill:  0    Orders Placed This Encounter  Procedures  . CBC with Differential  . Comprehensive metabolic panel  . Lipid Panel  . TSH  . Vitamin D, 25-hydroxy  . Vitamin B12  . POCT glycosylated hemoglobin (Hb A1C)  . POCT urinalysis dipstick    Referral Orders  No referral(s) requested today  Julia Becton,  MSN, FNP-C [facility] [address] [place], [place] [address] [phone]  Problem List Items Addressed This Visit    None    Visit Diagnoses    Establishing care with new  doctor, encounter for    -  Primary   Hypertension, unspecified type       Shortness of breath       Screening for diabetes mellitus       Relevant Orders   POCT glycosylated hemoglobin (Hb A1C) (Completed)   POCT urinalysis dipstick (Completed)   Urinary tract infection with hematuria, site unspecified       Relevant Medications   sulfamethoxazole-trimethoprim (BACTRIM DS) 800-160 MG tablet   Healthcare maintenance       Relevant Orders   CBC with Differential   Comprehensive metabolic panel   Lipid Panel   TSH   Vitamin D, 25-hydroxy   Vitamin B12   Follow up          Meds ordered this encounter  Medications  . sulfamethoxazole-trimethoprim (BACTRIM DS) 800-160 MG tablet    Sig: Take 1 tablet by mouth 2 (two) times daily.    Dispense:  14 tablet    Refill:  0    Follow-up: Return in about 6 months (around 05/06/2019).    Azzie Glatter, FNP

## 2018-11-04 LAB — CBC WITH DIFFERENTIAL/PLATELET
Basophils Absolute: 0 10*3/uL (ref 0.0–0.2)
Basos: 0 %
EOS (ABSOLUTE): 0.1 10*3/uL (ref 0.0–0.4)
Eos: 1 %
Hematocrit: 40.8 % (ref 34.0–46.6)
Hemoglobin: 13.7 g/dL (ref 11.1–15.9)
Immature Grans (Abs): 0 10*3/uL (ref 0.0–0.1)
Immature Granulocytes: 0 %
Lymphocytes Absolute: 2 10*3/uL (ref 0.7–3.1)
Lymphs: 32 %
MCH: 30.8 pg (ref 26.6–33.0)
MCHC: 33.6 g/dL (ref 31.5–35.7)
MCV: 92 fL (ref 79–97)
Monocytes Absolute: 0.4 10*3/uL (ref 0.1–0.9)
Monocytes: 7 %
Neutrophils Absolute: 3.8 10*3/uL (ref 1.4–7.0)
Neutrophils: 60 %
Platelets: 252 10*3/uL (ref 150–450)
RBC: 4.45 x10E6/uL (ref 3.77–5.28)
RDW: 11.9 % (ref 11.7–15.4)
WBC: 6.4 10*3/uL (ref 3.4–10.8)

## 2018-11-04 LAB — TSH: TSH: 0.233 u[IU]/mL — ABNORMAL LOW (ref 0.450–4.500)

## 2018-11-04 LAB — COMPREHENSIVE METABOLIC PANEL
ALT: 14 IU/L (ref 0–32)
AST: 15 IU/L (ref 0–40)
Albumin/Globulin Ratio: 1.5 (ref 1.2–2.2)
Albumin: 4.3 g/dL (ref 3.8–4.8)
Alkaline Phosphatase: 81 IU/L (ref 39–117)
BUN/Creatinine Ratio: 16 (ref 12–28)
BUN: 11 mg/dL (ref 8–27)
Bilirubin Total: 0.3 mg/dL (ref 0.0–1.2)
CO2: 22 mmol/L (ref 20–29)
Calcium: 9.9 mg/dL (ref 8.7–10.3)
Chloride: 102 mmol/L (ref 96–106)
Creatinine, Ser: 0.69 mg/dL (ref 0.57–1.00)
GFR calc Af Amer: 107 mL/min/{1.73_m2} (ref >59)
GFR calc non Af Amer: 93 mL/min/{1.73_m2} (ref >59)
Globulin, Total: 2.9 g/dL (ref 1.5–4.5)
Glucose: 104 mg/dL — ABNORMAL HIGH (ref 65–99)
Potassium: 4.5 mmol/L (ref 3.5–5.2)
Sodium: 136 mmol/L (ref 134–144)
Total Protein: 7.2 g/dL (ref 6.0–8.5)

## 2018-11-04 LAB — VITAMIN B12: Vitamin B-12: 273 pg/mL (ref 232–1245)

## 2018-11-04 LAB — VITAMIN D 25 HYDROXY (VIT D DEFICIENCY, FRACTURES): Vit D, 25-Hydroxy: 9.5 ng/mL — ABNORMAL LOW (ref 30.0–100.0)

## 2018-11-04 LAB — LIPID PANEL
Chol/HDL Ratio: 4 ratio (ref 0.0–4.4)
Cholesterol, Total: 193 mg/dL (ref 100–199)
HDL: 48 mg/dL (ref >39)
LDL Calculated: 124 mg/dL — ABNORMAL HIGH (ref 0–99)
Triglycerides: 106 mg/dL (ref 0–149)
VLDL Cholesterol Cal: 21 mg/dL (ref 5–40)

## 2018-11-10 DIAGNOSIS — H524 Presbyopia: Secondary | ICD-10-CM | POA: Diagnosis not present

## 2018-11-10 DIAGNOSIS — H52209 Unspecified astigmatism, unspecified eye: Secondary | ICD-10-CM | POA: Diagnosis not present

## 2018-11-10 DIAGNOSIS — H5203 Hypermetropia, bilateral: Secondary | ICD-10-CM | POA: Diagnosis not present

## 2018-11-11 ENCOUNTER — Other Ambulatory Visit: Payer: Self-pay | Admitting: Family Medicine

## 2018-11-11 DIAGNOSIS — E0590 Thyrotoxicosis, unspecified without thyrotoxic crisis or storm: Secondary | ICD-10-CM

## 2018-11-11 DIAGNOSIS — E559 Vitamin D deficiency, unspecified: Secondary | ICD-10-CM

## 2018-11-11 MED ORDER — VITAMIN D (ERGOCALCIFEROL) 1.25 MG (50000 UNIT) PO CAPS: 50000.0000 [IU] | ORAL_CAPSULE | ORAL | 3 refills | Status: DC

## 2018-11-22 ENCOUNTER — Other Ambulatory Visit: Payer: Self-pay | Admitting: Family Medicine

## 2018-11-22 ENCOUNTER — Other Ambulatory Visit: Payer: Self-pay | Admitting: Cardiovascular Disease

## 2018-11-23 NOTE — Telephone Encounter (Signed)
Rx request sent to pharmacy.  

## 2018-11-29 ENCOUNTER — Encounter: Payer: Self-pay | Admitting: Family Medicine

## 2018-11-29 ENCOUNTER — Other Ambulatory Visit: Payer: Self-pay

## 2018-11-29 ENCOUNTER — Ambulatory Visit (INDEPENDENT_AMBULATORY_CARE_PROVIDER_SITE_OTHER): Payer: Medicare HMO | Admitting: Family Medicine

## 2018-11-29 VITALS — BP 128/78 | HR 76 | Temp 98.1°F | Ht 66.5 in | Wt 228.0 lb

## 2018-11-29 DIAGNOSIS — R319 Hematuria, unspecified: Secondary | ICD-10-CM

## 2018-11-29 DIAGNOSIS — E039 Hypothyroidism, unspecified: Secondary | ICD-10-CM

## 2018-11-29 DIAGNOSIS — N390 Urinary tract infection, site not specified: Secondary | ICD-10-CM | POA: Diagnosis not present

## 2018-11-29 DIAGNOSIS — K219 Gastro-esophageal reflux disease without esophagitis: Secondary | ICD-10-CM

## 2018-11-29 DIAGNOSIS — E0590 Thyrotoxicosis, unspecified without thyrotoxic crisis or storm: Secondary | ICD-10-CM | POA: Diagnosis not present

## 2018-11-29 DIAGNOSIS — N898 Other specified noninflammatory disorders of vagina: Secondary | ICD-10-CM | POA: Diagnosis not present

## 2018-11-29 DIAGNOSIS — K029 Dental caries, unspecified: Secondary | ICD-10-CM

## 2018-11-29 DIAGNOSIS — R8290 Unspecified abnormal findings in urine: Secondary | ICD-10-CM | POA: Diagnosis not present

## 2018-11-29 DIAGNOSIS — Z09 Encounter for follow-up examination after completed treatment for conditions other than malignant neoplasm: Secondary | ICD-10-CM | POA: Diagnosis not present

## 2018-11-29 HISTORY — DX: Hypothyroidism, unspecified: E03.9

## 2018-11-29 LAB — POCT URINALYSIS DIP (MANUAL ENTRY)
Bilirubin, UA: NEGATIVE
Glucose, UA: NEGATIVE mg/dL
Ketones, POC UA: NEGATIVE mg/dL
Nitrite, UA: NEGATIVE
Protein Ur, POC: NEGATIVE mg/dL
Spec Grav, UA: 1.025 (ref 1.010–1.025)
Urobilinogen, UA: 1 E.U./dL
pH, UA: 6 (ref 5.0–8.0)

## 2018-11-29 MED ORDER — OMEPRAZOLE 20 MG PO CPDR: 20.0000 mg | DELAYED_RELEASE_CAPSULE | Freq: Every day | ORAL | 6 refills | Status: DC

## 2018-11-29 MED ORDER — SULFAMETHOXAZOLE-TRIMETHOPRIM 800-160 MG PO TABS: 1.0000 | ORAL_TABLET | Freq: Two times a day (BID) | ORAL | 0 refills | Status: DC

## 2018-11-29 NOTE — Progress Notes (Signed)
[facility]   Established Patient Office Visit  Subjective:  Patient ID: Julia [name], female    DOB: 10/30/1955  Age: 63 y.o. MRN: [id number]  CC:  Chief Complaint  Patient presents with  . Vaginal Discharge  . Vaginal Itching  . Dysuria    HPI Julia [name] is a 63 year old female who presents for Follow Up today.   Past Medical History:  Diagnosis Date  . Allergy   . Anxiety   . CHF (congestive heart failure) ([place])   . GERD (gastroesophageal reflux disease)   . Hypertension   . Mitral regurgitation    a. severe by ECHO  01/2014  . Systolic CHF ([place])    a. 2D ECHO: 02/09/2014; EF 35-30%, mild LV dilation, severe, diffuse hypokinesis, regional WMAs cannot be excluded. Ventricular septum with mild dyssynergy c/w LBBB, mild AR, severe MR, severely dilated LA.  . Tobacco abuse    Current Status: Since her last office visit, she has c/o vaginal odor, itching, and discharge X 7 days. She states that she has not used any medication for relief of symptoms. She denies urinary frequency, dysuria, urinary burning, hematuria, and suprapubic pain/discomfort. She denies visual changes, chest pain, cough, shortness of breath, heart palpitations, and falls. She has occasional headaches and dizziness with position changes. Denies severe headaches, confusion, seizures, double vision, and blurred vision, nausea and vomiting.  She denies fevers, chills, fatigue, recent infections, weight loss, and night sweats. No reports of GI problems such as diarrhea, and constipation. She has no reports of blood in stools, dysuria and hematuria. No depression or anxiety reported. She denies pain today.   Past Surgical History:  Procedure Laterality Date  . ABDOMINAL HYSTERECTOMY     partial  . CARDIAC CATHETERIZATION  01/2014   Normal coronaries. EF of 35 to 40%  . DIAGNOSTIC LAPAROSCOPY     to remove fibrioid tumor  . LEFT HEART CATHETERIZATION  WITH CORONARY ANGIOGRAM N/A 02/10/2014   Procedure: LEFT HEART CATHETERIZATION WITH CORONARY ANGIOGRAM;  Surgeon: Sinclair Grooms, MD;  Location: [facility] CATH LAB;  Service: Cardiovascular;  Laterality: N/A;  . MOUTH SURGERY N/A 2017  . TUBAL LIGATION      Family History  Problem Relation Age of Onset  . Diabetes Mother   . Hypertension Mother   . Diabetes Father   . Heart disease Father   . Diabetes Sister   . Drug abuse Sister   . Hypertension Sister   . Mental illness Sister   . Stroke Sister   . Miscarriages / Stillbirths Sister   . Alcohol abuse Brother   . Cancer Brother   . Alcohol abuse Maternal Aunt   . Alcohol abuse Maternal Uncle   . Alcohol abuse Paternal Uncle   . Cancer Paternal Uncle   . Colon cancer Neg Hx   . Colon polyps Neg Hx     Social History   Socioeconomic History  . Marital status: Single    Spouse name: Not on file  . Number of children: Not on file  . Years of education: Not on file  . Highest education level: Not on file  Occupational History  . Not on file  Social Needs  . Financial resource strain: Not on file  . Food insecurity:    Worry: Not on file    Inability: Not on file  . Transportation needs:    Medical: Not on file  Non-medical: Not on file  Tobacco Use  . Smoking status: Former Smoker    Packs/day: 0.50    Types: Cigarettes    Last attempt to quit: 02/15/2014    Years since quitting: 4.7  . Smokeless tobacco: Never Used  Substance and Sexual Activity  . Alcohol use: No    Alcohol/week: 0.0 standard drinks  . Drug use: No  . Sexual activity: Yes  Lifestyle  . Physical activity:    Days per week: Not on file    Minutes per session: Not on file  . Stress: Not on file  Relationships  . Social connections:    Talks on phone: Not on file    Gets together: Not on file    Attends religious service: Not on file    Active member of club or organization: Not on file    Attends meetings of clubs or organizations: Not on  file    Relationship status: Not on file  . Intimate partner violence:    Fear of current or ex partner: Not on file    Emotionally abused: Not on file    Physically abused: Not on file    Forced sexual activity: Not on file  Other Topics Concern  . Not on file  Social History Narrative  . Not on file    Outpatient Medications Prior to Visit  Medication Sig Dispense Refill  . carvedilol (COREG) 6.25 MG tablet TAKE 1 & 1/2 (ONE & ONE-HALF) TABLETS BY MOUTH TWICE DAILY WITH A MEAL. 90 tablet 1  . furosemide (LASIX) 40 MG tablet TAKE 1/2 (ONE-HALF) TABLET BY MOUTH ONCE DAILY 45 tablet 5  . ipratropium (ATROVENT) 0.03 % nasal spray Place 2 sprays into both nostrils 2 (two) times daily. 30 mL 0  . losartan (COZAAR) 50 MG tablet TAKE 1 TABLET BY MOUTH ONCE DAILY 90 tablet 3  . spironolactone (ALDACTONE) 25 MG tablet TAKE 1 TABLET BY MOUTH ONCE DAILY 90 tablet 3  . Vitamin D, Ergocalciferol, (DRISDOL) 1.25 MG (50000 UT) CAPS capsule Take 1 capsule (50,000 Units total) by mouth every 7 (seven) days. 5 capsule 3  . omeprazole (PRILOSEC) 20 MG capsule Take 1 capsule (20 mg total) by mouth daily. 30 capsule 0  . sulfamethoxazole-trimethoprim (BACTRIM DS) 800-160 MG tablet Take 1 tablet by mouth 2 (two) times daily. (Patient not taking: Reported on 11/29/2018) 14 tablet 0   Facility-Administered Medications Prior to Visit  Medication Dose Route Frequency Provider Last Rate Last Dose  . 0.9 %  sodium chloride infusion  500 mL Intravenous Continuous Nandigam, Kavitha V, MD        No Known Allergies  ROS Review of Systems  Constitutional: Negative.   HENT: Positive for dental problem (dental caries).   Eyes: Negative.   Respiratory: Negative.   Cardiovascular: Negative.   Gastrointestinal: Negative.   Endocrine: Negative.   Genitourinary: Positive for dysuria and vaginal discharge.  Musculoskeletal: Negative.   Skin: Negative.   Allergic/Immunologic: Negative.   Neurological: Negative.    Hematological: Negative.   Psychiatric/Behavioral: Negative.      Objective:    Physical Exam  Constitutional: She is oriented to person, place, and time. She appears well-developed and well-nourished.  HENT:  Head: Normocephalic and atraumatic.  Eyes: Conjunctivae are normal.  Neck: Normal range of motion. Neck supple.  Cardiovascular: Normal rate, regular rhythm, normal heart sounds and intact distal pulses.  Pulmonary/Chest: Effort normal and breath sounds normal.  Abdominal: Soft. Bowel sounds are normal.  Musculoskeletal: Normal  range of motion.  Neurological: She is alert and oriented to person, place, and time. She has normal reflexes.  Skin: Skin is warm and dry.  Psychiatric: She has a normal mood and affect. Her behavior is normal. Judgment and thought content normal.  Vitals reviewed.   BP 128/78 (BP Location: Right Arm, Patient Position: Sitting, Cuff Size: Large)   Pulse 76   Temp 98.1 F (36.7 C) (Oral)   Ht 5' 6.5" (1.689 m)   Wt 228 lb (103.4 kg)   SpO2 99%   BMI 36.25 kg/m  Wt Readings from Last 3 Encounters:  11/29/18 228 lb (103.4 kg)  11/03/18 225 lb (102.1 kg)  12/23/17 225 lb (102.1 kg)     Health Maintenance Due  Topic Date Due  . PAP SMEAR-Modifier  09/20/1976  . MAMMOGRAM  11/06/2018    There are no preventive care reminders to display for this patient.  Lab Results  Component Value Date   TSH 0.233 (L) 11/03/2018   Lab Results  Component Value Date   WBC 6.4 11/03/2018   HGB 13.7 11/03/2018   HCT 40.8 11/03/2018   MCV 92 11/03/2018   PLT 252 11/03/2018   Lab Results  Component Value Date   NA 136 11/03/2018   K 4.5 11/03/2018   CO2 22 11/03/2018   GLUCOSE 104 (H) 11/03/2018   BUN 11 11/03/2018   CREATININE 0.69 11/03/2018   BILITOT 0.3 11/03/2018   ALKPHOS 81 11/03/2018   AST 15 11/03/2018   ALT 14 11/03/2018   PROT 7.2 11/03/2018   ALBUMIN 4.3 11/03/2018   CALCIUM 9.9 11/03/2018   ANIONGAP 15 02/12/2014   GFR 97.38  03/28/2014   Lab Results  Component Value Date   CHOL 193 11/03/2018   Lab Results  Component Value Date   HDL 48 11/03/2018   Lab Results  Component Value Date   LDLCALC 124 (H) 11/03/2018   Lab Results  Component Value Date   TRIG 106 11/03/2018   Lab Results  Component Value Date   CHOLHDL 4.0 11/03/2018   Lab Results  Component Value Date   HGBA1C 5.7 (A) 11/03/2018      Assessment & Plan:   1. Vaginal discharge Swab results are pending.  - POCT urinalysis dipstick - NuSwab Vaginitis Plus (VG+)  2. Abnormal urinalysis Results are pending.  - Urine Culture  3. Urinary tract infection with hematuria, site unspecified We will initiate Septra.  - sulfamethoxazole-trimethoprim (BACTRIM DS) 800-160 MG tablet; Take 1 tablet by mouth 2 (two) times daily.  Dispense: 14 tablet; Refill: 0  4. Hyperthyroidism - Thyroid Panel With TSH  5. Pain due to dental caries  6. Gastroesophageal reflux disease without esophagitis We will initiate Prilosec today.  - omeprazole (PRILOSEC) 20 MG capsule; Take 1 capsule (20 mg total) by mouth daily.  Dispense: 30 capsule; Refill: 6  7. Follow up She will follow up in 3 months.   Meds ordered this encounter  Medications  . DISCONTD: sulfamethoxazole-trimethoprim (BACTRIM DS) 800-160 MG tablet    Sig: Take 1 tablet by mouth 2 (two) times daily.    Dispense:  14 tablet    Refill:  0  . sulfamethoxazole-trimethoprim (BACTRIM DS) 800-160 MG tablet    Sig: Take 1 tablet by mouth 2 (two) times daily.    Dispense:  14 tablet    Refill:  0  . omeprazole (PRILOSEC) 20 MG capsule    Sig: Take 1 capsule (20 mg total) by mouth daily.  Dispense:  30 capsule    Refill:  6    Orders Placed This Encounter  Procedures  . Urine Culture  . NuSwab Vaginitis Plus (VG+)  . POCT urinalysis dipstick    Referral Orders  No referral(s) requested today    [name],  MSN, FNP-BC [facility] [place] [phone]  Problem List Items Addressed This Visit    None    Visit Diagnoses    Vaginal discharge    -  Primary   Relevant Orders   POCT urinalysis dipstick (Completed)   NuSwab Vaginitis Plus (VG+)   Abnormal urinalysis       Relevant Orders   Urine Culture   Urinary tract infection with hematuria, site unspecified       Relevant Medications   sulfamethoxazole-trimethoprim (BACTRIM DS) 800-160 MG tablet   Hyperthyroidism       Pain due to dental caries       Relevant Medications   sulfamethoxazole-trimethoprim (BACTRIM DS) 800-160 MG tablet   Gastroesophageal reflux disease without esophagitis       Relevant Medications   omeprazole (PRILOSEC) 20 MG capsule   Follow up          Meds ordered this encounter  Medications  . DISCONTD: sulfamethoxazole-trimethoprim (BACTRIM DS) 800-160 MG tablet    Sig: Take 1 tablet by mouth 2 (two) times daily.    Dispense:  14 tablet    Refill:  0  . sulfamethoxazole-trimethoprim (BACTRIM DS) 800-160 MG tablet    Sig: Take 1 tablet by mouth 2 (two) times daily.    Dispense:  14 tablet    Refill:  0  . omeprazole (PRILOSEC) 20 MG capsule    Sig: Take 1 capsule (20 mg total) by mouth daily.    Dispense:  30 capsule    Refill:  6    Follow-up: Return in about 3 months (around 03/01/2019).    Azzie Glatter, FNP

## 2018-11-29 NOTE — Patient Instructions (Signed)
Sulfamethoxazole; Trimethoprim, SMX-TMP tablets What is this medicine? SULFAMETHOXAZOLE; TRIMETHOPRIM or SMX-TMP (suhl fuh meth OK suh zohl; trye METH oh prim) is a combination of a sulfonamide antibiotic and a second antibiotic, trimethoprim. It is used to treat or prevent certain kinds of bacterial infections. It will not work for colds, flu, or other viral infections. This medicine may be used for other purposes; ask your health care provider or pharmacist if you have questions. COMMON BRAND NAME(S): Bacter-Aid DS, Bactrim, Bactrim DS, Septra, Septra DS What should I tell my health care provider before I take this medicine? They need to know if you have any of these conditions: -anemia -asthma -being treated with anticonvulsants -if you frequently drink alcohol containing drinks -kidney disease -liver disease -low level of folic acid or glucose-6-phosphate dehydrogenase -poor nutrition or malabsorption -porphyria -severe allergies -thyroid disorder -an unusual or allergic reaction to sulfamethoxazole, trimethoprim, sulfa drugs, other medicines, foods, dyes, or preservatives -pregnant or trying to get pregnant -breast-feeding How should I use this medicine? Take this medicine by mouth with a full glass of water. Follow the directions on the prescription label. Take your medicine at regular intervals. Do not take it more often than directed. Do not skip doses or stop your medicine early. Talk to your pediatrician regarding the use of this medicine in children. Special care may be needed. This medicine has been used in children as young as 2 months of age. Overdosage: If you think you have taken too much of this medicine contact a poison control center or emergency room at once. NOTE: This medicine is only for you. Do not share this medicine with others. What if I miss a dose? If you miss a dose, take it as soon as you can. If it is almost time for your next dose, take only that dose. Do  not take double or extra doses. What may interact with this medicine? Do not take this medicine with any of the following medications: -aminobenzoate potassium -dofetilide -metronidazole This medicine may also interact with the following medications: -ACE inhibitors like benazepril, enalapril, lisinopril, and ramipril -birth control pills -cyclosporine -digoxin -diuretics -indomethacin -medicines for diabetes -methenamine -methotrexate -phenytoin -potassium supplements -pyrimethamine -sulfinpyrazone -tricyclic antidepressants -warfarin This list may not describe all possible interactions. Give your health care provider a list of all the medicines, herbs, non-prescription drugs, or dietary supplements you use. Also tell them if you smoke, drink alcohol, or use illegal drugs. Some items may interact with your medicine. What should I watch for while using this medicine? Tell your doctor or health care professional if your symptoms do not improve. Drink several glasses of water a day to reduce the risk of kidney problems. Do not treat diarrhea with over the counter products. Contact your doctor if you have diarrhea that lasts more than 2 days or if it is severe and watery. This medicine can make you more sensitive to the sun. Keep out of the sun. If you cannot avoid being in the sun, wear protective clothing and use a sunscreen. Do not use sun lamps or tanning beds/booths. What side effects may I notice from receiving this medicine? Side effects that you should report to your doctor or health care professional as soon as possible: -allergic reactions like skin rash or hives, swelling of the face, lips, or tongue -breathing problems -fever or chills, sore throat -irregular heartbeat, chest pain -joint or muscle pain -pain or difficulty passing urine -red pinpoint spots on skin -redness, blistering, peeling or loosening of   the skin, including inside the mouth -unusual bleeding or  bruising -unusually weak or tired -yellowing of the eyes or skin Side effects that usually do not require medical attention (report to your doctor or health care professional if they continue or are bothersome): -diarrhea -dizziness -headache -loss of appetite -nausea, vomiting -nervousness This list may not describe all possible side effects. Call your doctor for medical advice about side effects. You may report side effects to FDA at 1-800-FDA-1088. Where should I keep my medicine? Keep out of the reach of children. Store at room temperature between 20 to 25 degrees C (68 to 77 degrees F). Protect from light. Throw away any unused medicine after the expiration date. NOTE: This sheet is a summary. It may not cover all possible information. If you have questions about this medicine, talk to your doctor, pharmacist, or health care provider.  2019 Elsevier/Gold Standard (2013-01-21 14:38:26) Urinary Tract Infection, Adult A urinary tract infection (UTI) is an infection of any part of the urinary tract. The urinary tract includes:  The kidneys.  The ureters.  The bladder.  The urethra. These organs make, store, and get rid of pee (urine) in the body. What are the causes? This is caused by germs (bacteria) in your genital area. These germs grow and cause swelling (inflammation) of your urinary tract. What increases the risk? You are more likely to develop this condition if:  You have a small, thin tube (catheter) to drain pee.  You cannot control when you pee or poop (incontinence).  You are female, and: ? You use these methods to prevent pregnancy: ? A medicine that kills sperm (spermicide). ? A device that blocks sperm (diaphragm). ? You have low levels of a female hormone (estrogen). ? You are pregnant.  You have genes that add to your risk.  You are sexually active.  You take antibiotic medicines.  You have trouble peeing because of: ? A prostate that is bigger than  normal, if you are female. ? A blockage in the part of your body that drains pee from the bladder (urethra). ? A kidney stone. ? A nerve condition that affects your bladder (neurogenic bladder). ? Not getting enough to drink. ? Not peeing often enough.  You have other conditions, such as: ? Diabetes. ? A weak disease-fighting system (immune system). ? Sickle cell disease. ? Gout. ? Injury of the spine. What are the signs or symptoms? Symptoms of this condition include:  Needing to pee right away (urgently).  Peeing often.  Peeing small amounts often.  Pain or burning when peeing.  Blood in the pee.  Pee that smells bad or not like normal.  Trouble peeing.  Pee that is cloudy.  Fluid coming from the vagina, if you are female.  Pain in the belly or lower back. Other symptoms include:  Throwing up (vomiting).  No urge to eat.  Feeling mixed up (confused).  Being tired and grouchy (irritable).  A fever.  Watery poop (diarrhea). How is this treated? This condition may be treated with:  Antibiotic medicine.  Other medicines.  Drinking enough water. Follow these instructions at home:  Medicines  Take over-the-counter and prescription medicines only as told by your doctor.  If you were prescribed an antibiotic medicine, take it as told by your doctor. Do not stop taking it even if you start to feel better. General instructions  Make sure you: ? Pee until your bladder is empty. ? Do not hold pee for a long time. ?  Empty your bladder after sex. ? Wipe from front to back after pooping if you are a female. Use each tissue one time when you wipe.  Drink enough fluid to keep your pee pale yellow.  Keep all follow-up visits as told by your doctor. This is important. Contact a doctor if:  You do not get better after 1-2 days.  Your symptoms go away and then come back. Get help right away if:  You have very bad back pain.  You have very bad pain in  your lower belly.  You have a fever.  You are sick to your stomach (nauseous).  You are throwing up. Summary  A urinary tract infection (UTI) is an infection of any part of the urinary tract.  This condition is caused by germs in your genital area.  There are many risk factors for a UTI. These include having a small, thin tube to drain pee and not being able to control when you pee or poop.  Treatment includes antibiotic medicines for germs.  Drink enough fluid to keep your pee pale yellow. This information is not intended to replace advice given to you by your health care provider. Make sure you discuss any questions you have with your health care provider. Document Released: 12/03/2007 Document Revised: 12/24/2017 Document Reviewed: 12/24/2017 Elsevier Interactive Patient Education  2019 [employer].

## 2018-11-30 DIAGNOSIS — E0590 Thyrotoxicosis, unspecified without thyrotoxic crisis or storm: Secondary | ICD-10-CM | POA: Insufficient documentation

## 2018-11-30 DIAGNOSIS — K029 Dental caries, unspecified: Secondary | ICD-10-CM | POA: Insufficient documentation

## 2018-11-30 DIAGNOSIS — N898 Other specified noninflammatory disorders of vagina: Secondary | ICD-10-CM | POA: Insufficient documentation

## 2018-11-30 DIAGNOSIS — K219 Gastro-esophageal reflux disease without esophagitis: Secondary | ICD-10-CM | POA: Insufficient documentation

## 2018-11-30 LAB — THYROID PANEL WITH TSH
Free Thyroxine Index: 3.1 (ref 1.2–4.9)
T3 Uptake Ratio: 31 % (ref 24–39)
T4, Total: 10.1 ug/dL (ref 4.5–12.0)
TSH: 0.009 u[IU]/mL — ABNORMAL LOW (ref 0.450–4.500)

## 2018-12-01 LAB — URINE CULTURE

## 2018-12-06 ENCOUNTER — Other Ambulatory Visit: Payer: Self-pay | Admitting: Family Medicine

## 2018-12-06 ENCOUNTER — Encounter: Payer: Self-pay | Admitting: Family Medicine

## 2018-12-06 DIAGNOSIS — A599 Trichomoniasis, unspecified: Secondary | ICD-10-CM

## 2018-12-06 DIAGNOSIS — E039 Hypothyroidism, unspecified: Secondary | ICD-10-CM

## 2018-12-06 LAB — NUSWAB VAGINITIS PLUS (VG+)
Candida albicans, NAA: NEGATIVE
Candida glabrata, NAA: NEGATIVE
Chlamydia trachomatis, NAA: NEGATIVE
Neisseria gonorrhoeae, NAA: NEGATIVE
Trich vag by NAA: POSITIVE — AB

## 2018-12-06 MED ORDER — METRONIDAZOLE 500 MG PO TABS: 2000.0000 mg | ORAL_TABLET | Freq: Once | ORAL | 0 refills | Status: AC

## 2018-12-06 MED ORDER — LEVOTHYROXINE SODIUM 150 MCG PO TABS: 150.0000 ug | ORAL_TABLET | Freq: Every day | ORAL | 2 refills | Status: DC

## 2018-12-09 ENCOUNTER — Telehealth: Payer: Self-pay

## 2018-12-09 ENCOUNTER — Other Ambulatory Visit: Payer: Self-pay | Admitting: Family Medicine

## 2018-12-09 DIAGNOSIS — L298 Other pruritus: Secondary | ICD-10-CM

## 2018-12-09 DIAGNOSIS — T50905A Adverse effect of unspecified drugs, medicaments and biological substances, initial encounter: Secondary | ICD-10-CM

## 2018-12-09 MED ORDER — HYDROXYZINE HCL 10 MG PO TABS: 10.0000 mg | ORAL_TABLET | Freq: Three times a day (TID) | ORAL | 3 refills | Status: DC | PRN

## 2018-12-09 NOTE — Telephone Encounter (Signed)
Patient states that the levothyroxine is causing her to itch really bad and wants to know if there is any other medication options. Patient is aware that you are out of office today.

## 2018-12-10 ENCOUNTER — Telehealth: Payer: Self-pay | Admitting: Family Medicine

## 2018-12-10 NOTE — Telephone Encounter (Signed)
duplicate

## 2018-12-10 NOTE — Telephone Encounter (Signed)
Patient states that she is know having swelling of the lip and a rash.

## 2018-12-10 NOTE — Telephone Encounter (Signed)
Sent to nurse

## 2018-12-13 NOTE — Telephone Encounter (Signed)
Patient notified and will schedule visit

## 2018-12-16 ENCOUNTER — Telehealth: Payer: Self-pay

## 2018-12-16 NOTE — Telephone Encounter (Signed)
Patient negative screening and will be coming into the office

## 2018-12-17 ENCOUNTER — Other Ambulatory Visit: Payer: Self-pay

## 2018-12-17 ENCOUNTER — Ambulatory Visit (INDEPENDENT_AMBULATORY_CARE_PROVIDER_SITE_OTHER): Payer: Medicare HMO | Admitting: Family Medicine

## 2018-12-17 DIAGNOSIS — E039 Hypothyroidism, unspecified: Secondary | ICD-10-CM | POA: Diagnosis not present

## 2018-12-17 DIAGNOSIS — I10 Essential (primary) hypertension: Secondary | ICD-10-CM

## 2018-12-17 DIAGNOSIS — K219 Gastro-esophageal reflux disease without esophagitis: Secondary | ICD-10-CM | POA: Diagnosis not present

## 2018-12-17 DIAGNOSIS — Z09 Encounter for follow-up examination after completed treatment for conditions other than malignant neoplasm: Secondary | ICD-10-CM

## 2018-12-17 NOTE — Progress Notes (Signed)
Virtual Visit via Telephone Note  I connected with Julia [name] on 70/96/28 at  1:40 PM EDT by telephone and verified that I am speaking with the correct person using two identifiers.   I discussed the limitations, risks, security and privacy concerns of performing an evaluation and management service by telephone and the availability of in person appointments. I also discussed with the patient that there may be a patient responsible charge related to this service. The patient expressed understanding and agreed to proceed.   History of Present Illness:  Past Medical History:  Diagnosis Date  . Allergy   . Anxiety   . CHF (congestive heart failure) ([place])   . GERD (gastroesophageal reflux disease)   . Hypertension   . Hypothyroidism 11/2018  . Mitral regurgitation    a. severe by ECHO  01/2014  . Systolic CHF ([facility])    a. 2D ECHO: 02/09/2014; EF 35-30%, mild LV dilation, severe, diffuse hypokinesis, regional WMAs cannot be excluded. Ventricular septum with mild dyssynergy c/w LBBB, mild AR, severe MR, severely dilated LA.  . Tobacco abuse     Current Outpatient Medications on File Prior to Visit  Medication Sig Dispense Refill  . carvedilol (COREG) 6.25 MG tablet TAKE 1 & 1/2 (ONE & ONE-HALF) TABLETS BY MOUTH TWICE DAILY WITH A MEAL. 90 tablet 1  . furosemide (LASIX) 40 MG tablet TAKE 1/2 (ONE-HALF) TABLET BY MOUTH ONCE DAILY 45 tablet 5  . hydrOXYzine (ATARAX/VISTARIL) 10 MG tablet Take 1 tablet (10 mg total) by mouth 3 (three) times daily as needed for itching. 90 tablet 3  . ipratropium (ATROVENT) 0.03 % nasal spray Place 2 sprays into both nostrils 2 (two) times daily. 30 mL 0  . losartan (COZAAR) 50 MG tablet TAKE 1 TABLET BY MOUTH ONCE DAILY 90 tablet 3  . omeprazole (PRILOSEC) 20 MG capsule Take 1 capsule (20 mg total) by mouth daily. 30 capsule 6  . spironolactone (ALDACTONE) 25 MG tablet TAKE 1 TABLET BY MOUTH ONCE DAILY 90 tablet 3  . Vitamin D, Ergocalciferol, (DRISDOL)  1.25 MG (50000 UT) CAPS capsule Take 1 capsule (50,000 Units total) by mouth every 7 (seven) days. 5 capsule 3  . levothyroxine (SYNTHROID) 150 MCG tablet Take 1 tablet (150 mcg total) by mouth daily before breakfast. (Patient not taking: Reported on 12/17/2018) 30 tablet 2   Current Facility-Administered Medications on File Prior to Visit  Medication Dose Route Frequency Provider Last Rate Last Dose  . 0.9 %  sodium chloride infusion  500 mL Intravenous Continuous Nandigam, Kavitha V, MD        Current Status: Since her last office visit, she is doing well with no complaints. She denies visual changes, chest pain, cough, shortness of breath, heart palpitations, and falls. She has occasional headaches and dizziness with position changes. Denies severe headaches, confusion, seizures, double vision, and blurred vision, nausea and vomiting. She states that she experienced an allergic reaction from Synthroid and is no longer taking medication.   She denies fevers, chills, fatigue, recent infections, weight loss, and night sweats.  No reports of GI problems such as nausea, vomiting, diarrhea, and constipation. She has no reports of blood in stools, dysuria and hematuria. No depression or anxiety reported. She denies pain today.    Observations/Objective:  Telephone Virtual Visit   Assessment and Plan:  1. Hypothyroidism, unspecified type  2. Essential hypertension, malignant She will continue to decrease high sodium intake, excessive alcohol intake, increase potassium intake, smoking cessation, and increase  physical activity of at least 30 minutes of cardio activity daily. She will continue to follow Heart Healthy or DASH diet.  3. Gastroesophageal reflux disease without esophagitis  No orders of the defined types were placed in this encounter.   No orders of the defined types were placed in this encounter.   Referral Orders  No referral(s) requested today    [name],  MSN,  FNP-BC Patient Julia [name], [name] [phone]   Follow Up Instructions:  She will follow up in 3 months.     I discussed the assessment and treatment plan with the patient. The patient was provided an opportunity to ask questions and all were answered. The patient agreed with the plan and demonstrated an understanding of the instructions.   The patient was advised to call back or seek an in-person evaluation if the symptoms worsen or if the condition fails to improve as anticipated.  I provided 15 minutes of non-face-to-face time during this encounter.   Azzie Glatter, FNP

## 2018-12-21 ENCOUNTER — Other Ambulatory Visit: Payer: Self-pay

## 2018-12-21 DIAGNOSIS — L298 Other pruritus: Secondary | ICD-10-CM

## 2018-12-21 MED ORDER — HYDROXYZINE HCL 10 MG PO TABS: 10.0000 mg | ORAL_TABLET | Freq: Three times a day (TID) | ORAL | 3 refills | Status: DC | PRN

## 2018-12-21 NOTE — Progress Notes (Unsigned)
Medication sent to correct pharmacy  

## 2018-12-23 NOTE — Telephone Encounter (Signed)
Note not needed 

## 2019-01-04 ENCOUNTER — Other Ambulatory Visit: Payer: Self-pay | Admitting: Cardiovascular Disease

## 2019-01-04 DIAGNOSIS — I5022 Chronic systolic (congestive) heart failure: Secondary | ICD-10-CM

## 2019-01-04 DIAGNOSIS — I10 Essential (primary) hypertension: Secondary | ICD-10-CM

## 2019-01-04 DIAGNOSIS — E876 Hypokalemia: Secondary | ICD-10-CM

## 2019-01-20 ENCOUNTER — Telehealth: Payer: Self-pay | Admitting: Cardiovascular Disease

## 2019-01-20 NOTE — Telephone Encounter (Signed)
Virtual Visit Pre-Appointment Phone Call  "(Name), I am calling you today to discuss your upcoming appointment. We are currently trying to limit exposure to the virus that causes COVID-19 by seeing patients at home rather than in the office."  1. Confirm consent - "In the setting of the current Covid19 crisis, you are scheduled for a (phone or video) visit with your provider on (date) at (time).  Just as we do with many in-office visits, in order for you to participate in this visit, we must obtain consent.  If you'd like, I can send this to your mychart (if signed up) or email for you to review.  Otherwise, I can obtain your verbal consent now.  All virtual visits are billed to your insurance company just like a normal visit would be.  By agreeing to a virtual visit, we'd like you to understand that the technology does not allow for your provider to perform an examination, and thus may limit your provider's ability to fully assess your condition. If your provider identifies any concerns that need to be evaluated in person, we will make arrangements to do so.  Finally, though the technology is pretty good, we cannot assure that it will always work on either your or our end, and in the setting of a video visit, we may have to convert it to a phone-only visit.  In either situation, we cannot ensure that we have a secure connection.  Are you willing to proceed?" STAFF: Did the patient verbally acknowledge consent to telehealth visit? Document YES/NO here: Yes 2.       FULL LENGTH CONSENT FOR TELE-HEALTH VISIT   I hereby voluntarily request, consent and authorize [facility] and its employed or contracted physicians, physician assistants, nurse practitioners or other licensed health care professionals (the Practitioner), to provide me with telemedicine health care services (the Services") as deemed necessary by the treating Practitioner. I acknowledge and consent to receive the Services by the  Practitioner via telemedicine. I understand that the telemedicine visit will involve communicating with the Practitioner through live audiovisual communication technology and the disclosure of certain medical information by electronic transmission. I acknowledge that I have been given the opportunity to request an in-person assessment or other available alternative prior to the telemedicine visit and am voluntarily participating in the telemedicine visit.  I understand that I have the right to withhold or withdraw my consent to the use of telemedicine in the course of my care at any time, without affecting my right to future care or treatment, and that the Practitioner or I may terminate the telemedicine visit at any time. I understand that I have the right to inspect all information obtained and/or recorded in the course of the telemedicine visit and may receive copies of available information for a reasonable fee.  I understand that some of the potential risks of receiving the Services via telemedicine include:   Delay or interruption in medical evaluation due to technological equipment failure or disruption;  Information transmitted may not be sufficient (e.g. poor resolution of images) to allow for appropriate medical decision making by the Practitioner; and/or   In rare instances, security protocols could fail, causing a breach of personal health information.  Furthermore, I acknowledge that it is my responsibility to provide information about my medical history, conditions and care that is complete and accurate to the best of my ability. I acknowledge that Practitioner's advice, recommendations, and/or decision may be based on factors not within their control,  such as incomplete or inaccurate data provided by me or distortions of diagnostic images or specimens that may result from electronic transmissions. I understand that the practice of medicine is not an exact science and that Practitioner makes  no warranties or guarantees regarding treatment outcomes. I acknowledge that I will receive a copy of this consent concurrently upon execution via email to the email address I last provided but may also request a printed copy by calling the office of [facility].    I understand that my insurance will be billed for this visit.   I have read or had this consent read to me.  I understand the contents of this consent, which adequately explains the benefits and risks of the Services being provided via telemedicine.   I have been provided ample opportunity to ask questions regarding this consent and the Services and have had my questions answered to my satisfaction.  I give my informed consent for the services to be provided through the use of telemedicine in my medical care  By participating in this telemedicine visit I agree to the above.

## 2019-01-21 ENCOUNTER — Telehealth: Payer: Self-pay

## 2019-01-21 ENCOUNTER — Telehealth (INDEPENDENT_AMBULATORY_CARE_PROVIDER_SITE_OTHER): Payer: Medicare HMO | Admitting: Cardiovascular Disease

## 2019-01-21 DIAGNOSIS — Z8679 Personal history of other diseases of the circulatory system: Secondary | ICD-10-CM | POA: Diagnosis not present

## 2019-01-21 DIAGNOSIS — Z0389 Encounter for observation for other suspected diseases and conditions ruled out: Secondary | ICD-10-CM

## 2019-01-21 DIAGNOSIS — I340 Nonrheumatic mitral (valve) insufficiency: Secondary | ICD-10-CM | POA: Diagnosis not present

## 2019-01-21 DIAGNOSIS — I447 Left bundle-branch block, unspecified: Secondary | ICD-10-CM

## 2019-01-21 DIAGNOSIS — IMO0001 Reserved for inherently not codable concepts without codable children: Secondary | ICD-10-CM

## 2019-01-21 DIAGNOSIS — I10 Essential (primary) hypertension: Secondary | ICD-10-CM

## 2019-01-21 NOTE — Telephone Encounter (Signed)
Patient and/or DPR-approved person aware of 7/24 AVS instructions and verbalized understanding.  Letter including After Visit Summary and any other necessary documents to be mailed to the patient's address on file.  

## 2019-01-21 NOTE — Progress Notes (Signed)
Virtual Visit via Telephone Note   This visit type was conducted due to national recommendations for restrictions regarding the COVID-19 Pandemic (e.g. social distancing) in an effort to limit this patient's exposure and mitigate transmission in our community.  Due to her co-morbid illnesses, this patient is at least at moderate risk for complications without adequate follow up.  This format is felt to be most appropriate for this patient at this time.  The patient did not have access to video technology/had technical difficulties with video requiring transitioning to audio format only (telephone).  All issues noted in this document were discussed and addressed.  No physical exam could be performed with this format.  Please refer to the patient's chart for her  consent to telehealth for [facility].   Date:  2/69/4854   ID:  Julia [name], DOB [birth year], MRN [id number]  Patient Location: Home Provider Location: Home  PCP:  Azzie Glatter, FNP  Cardiologist: Dr. Quay Burow Electrophysiologist:  None   Evaluation Performed:  Follow-Up Visit  Chief Complaint: Follow-up hypertension and nonischemic cardiomyopathy  History of Present Illness:    Julia [name] is a 63 y.o.  mild to moderately overweight single African-American female mother of 2 children, grandmother of 37 grandchildren and has worked as a Glass blower/designer in the past and currently is on disability because of her cardiomyopathy.  I last saw her in the office 12/23/2017.  Her primary care provider is Dr. Liston Alba. She has seen Dr. Debara Pickett in the past. Her cardiovascular risk factors include 10-pack-years of tobacco having quit 3 years ago and treated hypertension. She's never had a heart attack or stroke. She denies chest pain but does get some dyspnea on exertion. She had a cardiac catheterization remotely that has shown normal coronary arteries and LV dysfunction and EF of 35% at that time which ultimately  improved to 50% on medications. Since she was seen by Dr. Debara Pickett 06/18/15 she does complain of some dyspnea on exertion but denies chest pain. She is compliant with her medications and her diet.  She gets occasional shortness of breath.  Her most recent 2D echo performed 12/10/2017 was entirely normal.  Since I saw her a year ago she is remained stable.  She does avoid salt.  She has no symptoms of COVID-19.  She denies chest pain or shortness of breath.  Recent blood pressure performed by her PCP was normal by her account.   The patient does not have symptoms concerning for COVID-19 infection (fever, chills, cough, or new shortness of breath).    Past Medical History:  Diagnosis Date   Allergy    Anxiety    CHF (congestive heart failure) ([facility])    GERD (gastroesophageal reflux disease)    Hypertension    Hypothyroidism 11/2018   Mitral regurgitation    a. severe by ECHO  08/7167   Systolic CHF ([place])    a. 2D ECHO: 02/09/2014; EF 35-30%, mild LV dilation, severe, diffuse hypokinesis, regional WMAs cannot be excluded. Ventricular septum with mild dyssynergy c/w LBBB, mild AR, severe MR, severely dilated LA.   Tobacco abuse    Past Surgical History:  Procedure Laterality Date   ABDOMINAL HYSTERECTOMY     partial   CARDIAC CATHETERIZATION  01/2014   Normal coronaries. EF of 35 to 40%   DIAGNOSTIC LAPAROSCOPY     to remove fibrioid tumor   LEFT HEART CATHETERIZATION WITH CORONARY ANGIOGRAM N/A 02/10/2014   Procedure: LEFT HEART CATHETERIZATION WITH CORONARY  Cyril Loosen;  Surgeon: Sinclair Grooms, MD;  [facility];  Service: Cardiovascular;  Laterality: N/A;   MOUTH SURGERY N/A 2017   TUBAL LIGATION       No outpatient medications have been marked as taking for the 01/21/19 encounter (Appointment) with Lorretta Harp, MD.   Current Facility-Administered Medications for the 01/21/19 encounter (Appointment) with Lorretta Harp, MD  Medication   0.9 %  sodium  chloride infusion     Allergies:   Patient has no known allergies.   Social History   Tobacco Use   Smoking status: Former Smoker    Packs/day: 0.50    Types: Cigarettes    Quit date: 02/15/2014    Years since quitting: 4.9   Smokeless tobacco: Never Used  Substance Use Topics   Alcohol use: No    Alcohol/week: 0.0 standard drinks   Drug use: No     Family Hx: The patient's family history includes Alcohol abuse in her brother, maternal aunt, maternal uncle, and paternal uncle; Cancer in her brother and paternal uncle; Diabetes in her father, mother, and sister; Drug abuse in her sister; Heart disease in her father; Hypertension in her mother and sister; Mental illness in her sister; Miscarriages / Stillbirths in her sister; Stroke in her sister. There is no history of Colon cancer or Colon polyps.  ROS:   Please see the history of present illness.     All other systems reviewed and are negative.   Prior CV studies:   The following studies were reviewed today:  None  Labs/Other Tests and Data Reviewed:    EKG:  No ECG reviewed.  Recent Labs: 11/03/2018: ALT 14; BUN 11; Creatinine, Ser 0.69; Hemoglobin 13.7; Platelets 252; Potassium 4.5; Sodium 136 11/29/2018: TSH 0.009   Recent Lipid Panel Lab Results  Component Value Date/Time   CHOL 193 11/03/2018 10:37 AM   TRIG 106 11/03/2018 10:37 AM   HDL 48 11/03/2018 10:37 AM   CHOLHDL 4.0 11/03/2018 10:37 AM   CHOLHDL 3.9 10/23/2016 11:13 AM   LDLCALC 124 (H) 11/03/2018 10:37 AM    Wt Readings from Last 3 Encounters:  11/29/18 228 lb (103.4 kg)  11/03/18 225 lb (102.1 kg)  12/23/17 225 lb (102.1 kg)     Objective:    Vital Signs:  There were no vitals taken for this visit.   VITAL SIGNS:  reviewed a complete physical exam was not performed today since this was a virtual telemedicine phone visit  ASSESSMENT & PLAN:    1. Nonischemic cardiomyopathy-history of normal coronary arteries by remote cath with LV  dysfunction which normalized with pharmacologic therapy on carvedilol, losartan and spironolactone.  Her EF was normal by 2D echo 08/12/2016.  She denies chest pain or shortness of breath. 2. Essential hypertension- history of essential hypertension on carvedilol, losartan and spironolactone with therapeutic blood pressures 3. Hyperlipidemia- recent lipid profile performed 11/03/2018 revealed total cholesterol 193, LDL 124 and HDL 48  COVID-19 Education: The signs and symptoms of COVID-19 were discussed with the patient and how to seek care for testing (follow up with PCP or arrange E-visit).  The importance of social distancing was discussed today.  Time:   Today, I have spent 4 minutes with the patient with telehealth technology discussing the above problems.     Medication Adjustments/Labs and Tests Ordered: Current medicines are reviewed at length with the patient today.  Concerns regarding medicines are outlined above.   Tests Ordered: No orders of the  defined types were placed in this encounter.   Medication Changes: No orders of the defined types were placed in this encounter.   Follow Up:  In Person in 1 year(s)  Signed, Quay Burow, MD  01/21/2019 1:18 PM    [place]

## 2019-01-21 NOTE — Patient Instructions (Signed)

## 2019-01-31 ENCOUNTER — Other Ambulatory Visit: Payer: Self-pay | Admitting: Cardiovascular Disease

## 2019-02-07 ENCOUNTER — Encounter: Payer: Self-pay | Admitting: Family Medicine

## 2019-02-07 ENCOUNTER — Ambulatory Visit (INDEPENDENT_AMBULATORY_CARE_PROVIDER_SITE_OTHER): Payer: Medicare HMO | Admitting: Family Medicine

## 2019-02-07 ENCOUNTER — Other Ambulatory Visit: Payer: Self-pay

## 2019-02-07 VITALS — BP 126/84 | HR 80 | Temp 98.0°F | Ht 66.5 in | Wt 232.0 lb

## 2019-02-07 DIAGNOSIS — Z09 Encounter for follow-up examination after completed treatment for conditions other than malignant neoplasm: Secondary | ICD-10-CM | POA: Diagnosis not present

## 2019-02-07 DIAGNOSIS — I10 Essential (primary) hypertension: Secondary | ICD-10-CM | POA: Diagnosis not present

## 2019-02-07 DIAGNOSIS — B379 Candidiasis, unspecified: Secondary | ICD-10-CM | POA: Diagnosis not present

## 2019-02-07 DIAGNOSIS — Z1239 Encounter for other screening for malignant neoplasm of breast: Secondary | ICD-10-CM

## 2019-02-07 DIAGNOSIS — R8290 Unspecified abnormal findings in urine: Secondary | ICD-10-CM

## 2019-02-07 DIAGNOSIS — E039 Hypothyroidism, unspecified: Secondary | ICD-10-CM | POA: Diagnosis not present

## 2019-02-07 LAB — POCT URINALYSIS DIP (MANUAL ENTRY)
Bilirubin, UA: NEGATIVE
Glucose, UA: NEGATIVE mg/dL
Ketones, POC UA: NEGATIVE mg/dL
Leukocytes, UA: NEGATIVE
Nitrite, UA: NEGATIVE
Protein Ur, POC: NEGATIVE mg/dL
Spec Grav, UA: 1.015 (ref 1.010–1.025)
Urobilinogen, UA: 0.2 E.U./dL
pH, UA: 5 (ref 5.0–8.0)

## 2019-02-07 MED ORDER — FLUCONAZOLE 150 MG PO TABS: 150.0000 mg | ORAL_TABLET | Freq: Once | ORAL | 0 refills | Status: AC

## 2019-02-07 NOTE — Patient Instructions (Addendum)
Exercising to Lose Weight Exercise is structured, repetitive physical activity to improve fitness and health. Getting regular exercise is important for everyone. It is especially important if you are overweight. Being overweight increases your risk of heart disease, stroke, diabetes, high blood pressure, and several types of cancer. Reducing your calorie intake and exercising can help you lose weight. Exercise is usually categorized as moderate or vigorous intensity. To lose weight, most people need to do a certain amount of moderate-intensity or vigorous-intensity exercise each week. Moderate-intensity exercise  Moderate-intensity exercise is any activity that gets you moving enough to burn at least three times more energy (calories) than if you were sitting. Examples of moderate exercise include:  Walking a mile in 15 minutes.  Doing light yard work.  Biking at an easy pace. Most people should get at least 150 minutes (2 hours and 30 minutes) a week of moderate-intensity exercise to maintain their body weight. Vigorous-intensity exercise Vigorous-intensity exercise is any activity that gets you moving enough to burn at least six times more calories than if you were sitting. When you exercise at this intensity, you should be working hard enough that you are not able to carry on a conversation. Examples of vigorous exercise include:  Running.  Playing a team sport, such as football, basketball, and soccer.  Jumping rope. Most people should get at least 75 minutes (1 hour and 15 minutes) a week of vigorous-intensity exercise to maintain their body weight. How can exercise affect me? When you exercise enough to burn more calories than you eat, you lose weight. Exercise also reduces body fat and builds muscle. The more muscle you have, the more calories you burn. Exercise also:  Improves mood.  Reduces stress and tension.  Improves your overall fitness, flexibility, and  endurance.  Increases bone strength. The amount of exercise you need to lose weight depends on:  Your age.  The type of exercise.  Any health conditions you have.  Your overall physical ability. Talk to your health care provider about how much exercise you need and what types of activities are safe for you. What actions can I take to lose weight? Nutrition   Make changes to your diet as told by your health care provider or diet and nutrition specialist (dietitian). This may include: ? Eating fewer calories. ? Eating more protein. ? Eating less unhealthy fats. ? Eating a diet that includes fresh fruits and vegetables, whole grains, low-fat dairy products, and lean protein. ? Avoiding foods with added fat, salt, and sugar.  Drink plenty of water while you exercise to prevent dehydration or heat stroke. Activity  Choose an activity that you enjoy and set realistic goals. Your health care provider can help you make an exercise plan that works for you.  Exercise at a moderate or vigorous intensity most days of the week. ? The intensity of exercise may vary from person to person. You can tell how intense a workout is for you by paying attention to your breathing and heartbeat. Most people will notice their breathing and heartbeat get faster with more intense exercise.  Do resistance training twice each week, such as: ? Push-ups. ? Sit-ups. ? Lifting weights. ? Using resistance bands.  Getting short amounts of exercise can be just as helpful as long structured periods of exercise. If you have trouble finding time to exercise, try to include exercise in your daily routine. ? Get up, stretch, and walk around every 30 minutes throughout the day. ? Go for a   walk during your lunch break. ? Park your car farther away from your destination. ? If you take public transportation, get off one stop early and walk the rest of the way. ? Make phone calls while standing up and walking  around. ? Take the stairs instead of elevators or escalators.  Wear comfortable clothes and shoes with good support.  Do not exercise so much that you hurt yourself, feel dizzy, or get very short of breath. Where to find more information  U.S[place] Department of Health and Human Services: BondedCompany.at  Centers for Disease Control and Prevention (CDC): http://www.wolf.info/ Contact a health care provider:  Before starting a new exercise program.  If you have questions or concerns about your weight.  If you have a medical problem that keeps you from exercising. Get help right away if you have any of the following while exercising:  Injury.  Dizziness.  Difficulty breathing or shortness of breath that does not go away when you stop exercising.  Chest pain.  Rapid heartbeat. Summary  Being overweight increases your risk of heart disease, stroke, diabetes, high blood pressure, and several types of cancer.  Losing weight happens when you burn more calories than you eat.  Reducing the amount of calories you eat in addition to getting regular moderate or vigorous exercise each week helps you lose weight. This information is not intended to replace advice given to you by your health care provider. Make sure you discuss any questions you have with your health care provider. Document Released: 07/19/2010 Document Revised: 06/29/2017 Document Reviewed: 06/29/2017 Elsevier Patient Education  2020 [place] DASH stands for "Dietary Approaches to Stop Hypertension." The DASH eating plan is a healthy eating plan that has been shown to reduce high blood pressure (hypertension). It may also reduce your risk for type 2 diabetes, heart disease, and stroke. The DASH eating plan may also help with weight loss. What are tips for following this plan?  General guidelines  Avoid eating more than 2,300 mg (milligrams) of salt (sodium) a day. If you have hypertension, you may need to reduce  your sodium intake to 1,500 mg a day.  Limit alcohol intake to no more than 1 drink a day for nonpregnant women and 2 drinks a day for men. One drink equals 12 oz of beer, 5 oz of wine, or 1 oz of hard liquor.  Work with your health care provider to maintain a healthy body weight or to lose weight. Ask what an ideal weight is for you.  Get at least 30 minutes of exercise that causes your heart to beat faster (aerobic exercise) most days of the week. Activities may include walking, swimming, or biking.  Work with your health care provider or diet and nutrition specialist (dietitian) to adjust your eating plan to your individual calorie needs. Reading food labels   Check food labels for the amount of sodium per serving. Choose foods with less than 5 percent of the Daily Value of sodium. Generally, foods with less than 300 mg of sodium per serving fit into this eating plan.  To find whole grains, look for the word "whole" as the first word in the ingredient list. Shopping  Buy products labeled as "low-sodium" or "no salt added."  Buy fresh foods. Avoid canned foods and premade or frozen meals. Cooking  Avoid adding salt when cooking. Use salt-free seasonings or herbs instead of table salt or sea salt. Check with your health care provider or pharmacist before using  salt substitutes.  Do not fry foods. Cook foods using healthy methods such as baking, boiling, grilling, and broiling instead.  Cook with heart-healthy oils, such as olive, canola, soybean, or sunflower oil. Meal planning  Eat a balanced diet that includes: ? 5 or more servings of fruits and vegetables each day. At each meal, try to fill half of your plate with fruits and vegetables. ? Up to 6-8 servings of whole grains each day. ? Less than 6 oz of lean meat, poultry, or fish each day. A 3-oz serving of meat is about the same size as a deck of cards. One egg equals 1 oz. ? 2 servings of low-fat dairy each day. ? A serving  of nuts, seeds, or beans 5 times each week. ? Heart-healthy fats. Healthy fats called Omega-3 fatty acids are found in foods such as flaxseeds and coldwater fish, like sardines, salmon, and mackerel.  Limit how much you eat of the following: ? Canned or prepackaged foods. ? Food that is high in trans fat, such as fried foods. ? Food that is high in saturated fat, such as fatty meat. ? Sweets, desserts, sugary drinks, and other foods with added sugar. ? Full-fat dairy products.  Do not salt foods before eating.  Try to eat at least 2 vegetarian meals each week.  Eat more home-cooked food and less restaurant, buffet, and fast food.  When eating at a restaurant, ask that your food be prepared with less salt or no salt, if possible. What foods are recommended? The items listed may not be a complete list. Talk with your dietitian about what dietary choices are best for you. Grains Whole-grain or whole-wheat bread. Whole-grain or whole-wheat pasta. Brown rice. [place]. Bulgur. Whole-grain and low-sodium cereals. Pita bread. Low-fat, low-sodium crackers. Whole-wheat flour tortillas. Vegetables Fresh or frozen vegetables (raw, steamed, roasted, or grilled). Low-sodium or reduced-sodium tomato and vegetable juice. Low-sodium or reduced-sodium tomato sauce and tomato paste. Low-sodium or reduced-sodium canned vegetables. Fruits All fresh, dried, or frozen fruit. Canned fruit in natural juice (without added sugar). Meat and other protein foods Skinless chicken or [place]. Ground chicken or [place]. Pork with fat trimmed off. Fish and seafood. Egg whites. Dried beans, peas, or lentils. Unsalted nuts, nut butters, and seeds. Unsalted canned beans. Lean cuts of beef with fat trimmed off. Low-sodium, lean deli meat. Dairy Low-fat (1%) or fat-free (skim) milk. Fat-free, low-fat, or reduced-fat cheeses. Nonfat, low-sodium ricotta or cottage cheese. Low-fat or nonfat yogurt. Low-fat, low-sodium  cheese. Fats and oils Soft margarine without trans fats. Vegetable oil. Low-fat, reduced-fat, or light mayonnaise and salad dressings (reduced-sodium). Canola, safflower, olive, soybean, and sunflower oils. Avocado. Seasoning and other foods Herbs. Spices. Seasoning mixes without salt. Unsalted popcorn and pretzels. Fat-free sweets. What foods are not recommended? The items listed may not be a complete list. Talk with your dietitian about what dietary choices are best for you. Grains Baked goods made with fat, such as croissants, muffins, or some breads. Dry pasta or rice meal packs. Vegetables Creamed or fried vegetables. Vegetables in a cheese sauce. Regular canned vegetables (not low-sodium or reduced-sodium). Regular canned tomato sauce and paste (not low-sodium or reduced-sodium). Regular tomato and vegetable juice (not low-sodium or reduced-sodium). Angie Fava. Olives. Fruits Canned fruit in a light or heavy syrup. Fried fruit. Fruit in cream or butter sauce. Meat and other protein foods Fatty cuts of meat. Ribs. Fried meat. Berniece Salines. Sausage. Bologna and other processed lunch meats. Salami. Fatback. Hotdogs. Bratwurst. Salted nuts and seeds.  Canned beans with added salt. Canned or smoked fish. Whole eggs or egg yolks. Chicken or [place] with skin. Dairy Whole or 2% milk, cream, and half-and-half. Whole or full-fat cream cheese. Whole-fat or sweetened yogurt. Full-fat cheese. Nondairy creamers. Whipped toppings. Processed cheese and cheese spreads. Fats and oils Butter. Stick margarine. Lard. Shortening. Ghee. Bacon fat. Tropical oils, such as coconut, palm kernel, or palm oil. Seasoning and other foods Salted popcorn and pretzels. Onion salt, garlic salt, seasoned salt, table salt, and sea salt. Worcestershire sauce. Tartar sauce. Barbecue sauce. Teriyaki sauce. Soy sauce, including reduced-sodium. Steak sauce. Canned and packaged gravies. Fish sauce. Oyster sauce. Cocktail sauce. Horseradish  that you find on the shelf. Ketchup. Mustard. Meat flavorings and tenderizers. Bouillon cubes. Hot sauce and Tabasco sauce. Premade or packaged marinades. Premade or packaged taco seasonings. Relishes. Regular salad dressings. Where to find more information:  National Heart, Lung, and [place]: https://wilson-eaton.com/  American Heart Association: www.heart.org Summary  The DASH eating plan is a healthy eating plan that has been shown to reduce high blood pressure (hypertension). It may also reduce your risk for type 2 diabetes, heart disease, and stroke.  With the DASH eating plan, you should limit salt (sodium) intake to 2,300 mg a day. If you have hypertension, you may need to reduce your sodium intake to 1,500 mg a day.  When on the DASH eating plan, aim to eat more fresh fruits and vegetables, whole grains, lean proteins, low-fat dairy, and heart-healthy fats.  Work with your health care provider or diet and nutrition specialist (dietitian) to adjust your eating plan to your individual calorie needs. This information is not intended to replace advice given to you by your health care provider. Make sure you discuss any questions you have with your health care provider. Document Released: 06/05/2011 Document Revised: 05/29/2017 Document Reviewed: 06/09/2016 Elsevier Patient Education  [place]. Fluconazole tablets What is this medicine? FLUCONAZOLE (floo KON na zole) is an antifungal medicine. It is used to treat certain kinds of fungal or yeast infections. This medicine may be used for other purposes; ask your health care provider or pharmacist if you have questions. COMMON BRAND NAME(S): Diflucan What should I tell my health care provider before I take this medicine? They need to know if you have any of these conditions:  history of irregular heart beat  kidney disease  an unusual or allergic reaction to fluconazole, other azole antifungals, medicines, foods, dyes, or  preservatives  pregnant or trying to get pregnant  breast-feeding How should I use this medicine? Take this medicine by mouth. Follow the directions on the prescription label. Do not take your medicine more often than directed. Talk to your pediatrician regarding the use of this medicine in children. Special care may be needed. This medicine has been used in children as young as 49 months of age. Overdosage: If you think you have taken too much of this medicine contact a poison control center or emergency room at once. NOTE: This medicine is only for you. Do not share this medicine with others. What if I miss a dose? If you miss a dose, take it as soon as you can. If it is almost time for your next dose, take only that dose. Do not take double or extra doses. What may interact with this medicine? Do not take this medicine with any of the following medications:  astemizole  certain medicines for irregular heart beat like dronedarone, quinidine  cisapride  erythromycin  lomitapide  other medicines that prolong the QT interval (cause an abnormal heart rhythm)  pimozide  terfenadine  thioridazine  tolvaptan This medicine may also interact with the following medications:  antiviral medicines for HIV or AIDS  birth control pills  certain antibiotics like rifabutin, rifampin  certain medicines for blood pressure like amlodipine, isradipine, felodipine, hydrochlorothiazide, losartan, nifedipine  certain medicines for cancer like cyclophosphamide, vinblastine, vincristine  certain medicines for cholesterol like atorvastatin, lovastatin, fluvastatin, simvastatin  certain medicines for depression, anxiety, or psychotic disturbances like amitriptyline, midazolam, nortriptyline, triazolam  certain medicines for diabetes like glipizide, glyburide, tolbutamide  certain medicines for pain like alfentanil, fentanyl, methadone  certain medicines for seizures like carbamazepine,  phenytoin  certain medicines that treat or prevent blood clots like warfarin  dofetilide  halofantrine  medicines that lower your chance of fighting infection like cyclosporine, prednisone, tacrolimus  NSAIDS, medicines for pain and inflammation, like celecoxib, diclofenac, flurbiprofen, ibuprofen, meloxicam, naproxen  other medicines for fungal infections  sirolimus  theophylline  tofacitinib  ziprasidone This list may not describe all possible interactions. Give your health care provider a list of all the medicines, herbs, non-prescription drugs, or dietary supplements you use. Also tell them if you smoke, drink alcohol, or use illegal drugs. Some items may interact with your medicine. What should I watch for while using this medicine? Visit your doctor or health care professional for regular checkups. If you are taking this medicine for a long time you may need blood work. Tell your doctor if your symptoms do not improve. Some fungal infections need many weeks or months of treatment to cure. Alcohol can increase possible damage to your liver. Avoid alcoholic drinks. If you have a vaginal infection, do not have sex until you have finished your treatment. You can wear a sanitary napkin. Do not use tampons. Wear freshly washed cotton, not synthetic, panties. What side effects may I notice from receiving this medicine? Side effects that you should report to your doctor or health care professional as soon as possible:  allergic reactions like skin rash or itching, hives, swelling of the lips, mouth, tongue, or throat  dark urine  feeling dizzy or faint  irregular heartbeat or chest pain  redness, blistering, peeling or loosening of the skin, including inside the mouth  trouble breathing  unusual bruising or bleeding  vomiting  yellowing of the eyes or skin Side effects that usually do not require medical attention (report to your doctor or health care professional if they  continue or are bothersome):  changes in how food tastes  diarrhea  headache  stomach upset or nausea This list may not describe all possible side effects. Call your doctor for medical advice about side effects. You may report side effects to FDA at 1-800-FDA-1088. Where should I keep my medicine? Keep out of the reach of children. Store at room temperature below 30 degrees C (86 degrees F). Throw away any medicine after the expiration date. NOTE: This sheet is a summary. It may not cover all possible information. If you have questions about this medicine, talk to your doctor, pharmacist, or health care provider.  2020 Elsevier/Gold Standard (2018-06-07 11:58:26)  Vaginal Yeast infection, Adult  Vaginal yeast infection is a condition that causes vaginal discharge as well as soreness, swelling, and redness (inflammation) of the vagina. This is a common condition. Some women get this infection frequently. What are the causes? This condition is caused by a change in the normal balance of the yeast (candida) and bacteria  that live in the vagina. This change causes an overgrowth of yeast, which causes the inflammation. What increases the risk? The condition is more likely to develop in women who:  Take antibiotic medicines.  Have diabetes.  Take birth control pills.  Are pregnant.  Douche often.  Have a weak body defense system (immune system).  Have been taking steroid medicines for a long time.  Frequently wear tight clothing. What are the signs or symptoms? Symptoms of this condition include:  White, thick, creamy vaginal discharge.  Swelling, itching, redness, and irritation of the vagina. The lips of the vagina (vulva) may be affected as well.  Pain or a burning feeling while urinating.  Pain during sex. How is this diagnosed? This condition is diagnosed based on:  Your medical history.  A physical exam.  A pelvic exam. Your health care provider will examine a  sample of your vaginal discharge under a microscope. Your health care provider may send this sample for testing to confirm the diagnosis. How is this treated? This condition is treated with medicine. Medicines may be over-the-counter or prescription. You may be told to use one or more of the following:  Medicine that is taken by mouth (orally).  Medicine that is applied as a cream (topically).  Medicine that is inserted directly into the vagina (suppository). Follow these instructions at home:  Lifestyle  Do not have sex until your health care provider approves. Tell your sex partner that you have a yeast infection. That person should go to his or her health care provider and ask if they should also be treated.  Do not wear tight clothes, such as pantyhose or tight pants.  Wear breathable cotton underwear. General instructions  Take or apply over-the-counter and prescription medicines only as told by your health care provider.  Eat more yogurt. This may help to keep your yeast infection from returning.  Do not use tampons until your health care provider approves.  Try taking a sitz bath to help with discomfort. This is a warm water bath that is taken while you are sitting down. The water should only come up to your hips and should cover your buttocks. Do this 3-4 times per day or as told by your health care provider.  Do not douche.  If you have diabetes, keep your blood sugar levels under control.  Keep all follow-up visits as told by your health care provider. This is important. Contact a health care provider if:  You have a fever.  Your symptoms go away and then return.  Your symptoms do not get better with treatment.  Your symptoms get worse.  You have new symptoms.  You develop blisters in or around your vagina.  You have blood coming from your vagina and it is not your menstrual period.  You develop pain in your abdomen. Summary  Vaginal yeast infection is a  condition that causes discharge as well as soreness, swelling, and redness (inflammation) of the vagina.  This condition is treated with medicine. Medicines may be over-the-counter or prescription.  Take or apply over-the-counter and prescription medicines only as told by your health care provider.  Do not douche. Do not have sex or use tampons until your health care provider approves.  Contact a health care provider if your symptoms do not get better with treatment or your symptoms go away and then return. This information is not intended to replace advice given to you by your health care provider. Make sure you discuss any  questions you have with your health care provider. Document Released: 03/26/2005 Document Revised: 11/02/2017 Document Reviewed: 11/02/2017 Elsevier Patient Education  [place].

## 2019-02-07 NOTE — Progress Notes (Signed)
Patient [facility]   Established Patient Office Visit  Subjective:  Patient ID: Julia [name], female    DOB: May 22, 1956  Age: 63 y.o. MRN: [id number]  CC:  Chief Complaint  Patient presents with  . Follow-up    Chronic condition     HPI Julia [name] is a 63 year old female who presents for follow up today.   Past Medical History:  Diagnosis Date  . Allergy   . Anxiety   . CHF (congestive heart failure) ([place])   . GERD (gastroesophageal reflux disease)   . Hypertension   . Hypothyroidism 11/2018  . Mitral regurgitation    a. severe by ECHO  01/2014  . Systolic CHF ([place])    a. 2D ECHO: 02/09/2014; EF 35-30%, mild LV dilation, severe, diffuse hypokinesis, regional WMAs cannot be excluded. Ventricular septum with mild dyssynergy c/w LBBB, mild AR, severe MR, severely dilated LA.  . Tobacco abuse    Current Status: Since her last office visit, she is doing well today, with no complaints. She states that she had a mild allergic reaction to Synthroid so she discontinued medication. She reported mild swelling of both lips. She denies fatigue, inability to tolerate cold, unexplained weight loss, dry skin, heart palpitations, chest pain, coarse hair, decrease thought process, constipation, depression.    She denies visual changes, chest pain, cough, shortness of breath, and falls. She has occasional headaches and dizziness with position changes. Denies severe headaches, confusion, seizures, double vision, and blurred vision, nausea and vomiting. Her anxiety is mild today. She denies suicidal ideations, homicidal ideations, or auditory hallucinations. She reports yeast infection after wearing tight clothing.   She denies fevers, chills, fatigue, recent infections, weight loss, and night sweats. She has not had any visual changes, and falls. No chest pain, heart palpitations, cough and shortness of breath reported. No reports of GI problems such  as diarrhea, and constipation. She has no reports of blood in stools, dysuria and hematuria. She denies pain today.    Past Surgical History:  Procedure Laterality Date  . ABDOMINAL HYSTERECTOMY     partial  . CARDIAC CATHETERIZATION  01/2014   Normal coronaries. EF of 35 to 40%  . DIAGNOSTIC LAPAROSCOPY     to remove fibrioid tumor  . LEFT HEART CATHETERIZATION WITH CORONARY ANGIOGRAM N/A 02/10/2014   Procedure: LEFT HEART CATHETERIZATION WITH CORONARY ANGIOGRAM;  Surgeon: Sinclair Grooms, MD;  Location: [facility] CATH LAB;  Service: Cardiovascular;  Laterality: N/A;  . MOUTH SURGERY N/A 2017  . TUBAL LIGATION      Family History  Problem Relation Age of Onset  . Diabetes Mother   . Hypertension Mother   . Diabetes Father   . Heart disease Father   . Diabetes Sister   . Drug abuse Sister   . Hypertension Sister   . Mental illness Sister   . Stroke Sister   . Miscarriages / Stillbirths Sister   . Alcohol abuse Brother   . Cancer Brother   . Alcohol abuse Maternal Aunt   . Alcohol abuse Maternal Uncle   . Alcohol abuse Paternal Uncle   . Cancer Paternal Uncle   . Colon cancer Neg Hx   . Colon polyps Neg Hx     Social History   Socioeconomic History  . Marital status: Single    Spouse name: Not on file  . Number of children: Not on file  . Years of education: Not on file  .  Highest education level: Not on file  Occupational History  . Not on file  Social Needs  . Financial resource strain: Not on file  . Food insecurity    Worry: Not on file    Inability: Not on file  . Transportation needs    Medical: Not on file    Non-medical: Not on file  Tobacco Use  . Smoking status: Former Smoker    Packs/day: 0.50    Types: Cigarettes    Quit date: 02/15/2014    Years since quitting: 4.9  . Smokeless tobacco: Never Used  Substance and Sexual Activity  . Alcohol use: No    Alcohol/week: 0.0 standard drinks  . Drug use: No  . Sexual activity: Yes  Lifestyle  . Physical  activity    Days per week: Not on file    Minutes per session: Not on file  . Stress: Not on file  Relationships  . Social Herbalist on phone: Not on file    Gets together: Not on file    Attends religious service: Not on file    Active member of club or organization: Not on file    Attends meetings of clubs or organizations: Not on file    Relationship status: Not on file  . Intimate partner violence    Fear of current or ex partner: Not on file    Emotionally abused: Not on file    Physically abused: Not on file    Forced sexual activity: Not on file  Other Topics Concern  . Not on file  Social History Narrative  . Not on file    Outpatient Medications Prior to Visit  Medication Sig Dispense Refill  . carvedilol (COREG) 6.25 MG tablet TAKE 1 & 1/2 (ONE & ONE-HALF) TABLETS BY MOUTH TWICE DAILY WITH MEALS 90 tablet 3  . furosemide (LASIX) 40 MG tablet TAKE 1/2 (ONE-HALF) TABLET BY MOUTH ONCE DAILY 45 tablet 5  . ipratropium (ATROVENT) 0.03 % nasal spray Place 2 sprays into both nostrils 2 (two) times daily. 30 mL 0  . losartan (COZAAR) 50 MG tablet Take 1 tablet by mouth once daily 90 tablet 1  . omeprazole (PRILOSEC) 20 MG capsule Take 1 capsule (20 mg total) by mouth daily. 30 capsule 6  . spironolactone (ALDACTONE) 25 MG tablet Take 1 tablet by mouth once daily 90 tablet 1  . Vitamin D, Ergocalciferol, (DRISDOL) 1.25 MG (50000 UT) CAPS capsule Take 1 capsule (50,000 Units total) by mouth every 7 (seven) days. 5 capsule 3   Facility-Administered Medications Prior to Visit  Medication Dose Route Frequency Provider Last Rate Last Dose  . 0.9 %  sodium chloride infusion  500 mL Intravenous Continuous Nandigam, Venia Minks, MD        Allergies  Allergen Reactions  . Synthroid [Levothyroxine Sodium]     Lip swelling and itching.     ROS Review of Systems  Constitutional: Negative.   HENT: Negative.   Eyes: Negative.   Respiratory: Negative.   Cardiovascular:  Negative.   Gastrointestinal: Positive for abdominal distention (occasional ).  Endocrine: Negative.   Genitourinary: Positive for vaginal discharge.  Musculoskeletal: Negative.   Skin: Negative.   Allergic/Immunologic: Negative.   Neurological: Positive for dizziness (occasional) and headaches (occasional ).  Hematological: Negative.   Psychiatric/Behavioral: Negative.       Objective:    Physical Exam  Constitutional: She is oriented to person, place, and time. She appears well-developed and well-nourished.  HENT:  Head: Normocephalic and atraumatic.  Eyes: Conjunctivae are normal.  Neck: Normal range of motion. Neck supple.  Cardiovascular: Normal rate, regular rhythm, normal heart sounds and intact distal pulses.  Pulmonary/Chest: Effort normal and breath sounds normal.  Abdominal: Soft. Bowel sounds are normal.  Musculoskeletal: Normal range of motion.  Neurological: She is alert and oriented to person, place, and time. She has normal reflexes.  Skin: Skin is warm and dry.  Psychiatric: She has a normal mood and affect. Her behavior is normal. Judgment and thought content normal.  Nursing note and vitals reviewed.   BP 126/84 (BP Location: Right Arm, Patient Position: Sitting, Cuff Size: Large)   Pulse 80   Temp 98 F (36.7 C) (Oral)   Ht 5' 6.5" (1.689 m)   Wt 232 lb (105.2 kg)   SpO2 98%   BMI 36.88 kg/m  Wt Readings from Last 3 Encounters:  02/07/19 232 lb (105.2 kg)  11/29/18 228 lb (103.4 kg)  11/03/18 225 lb (102.1 kg)     Health Maintenance Due  Topic Date Due  . PAP SMEAR-Modifier  09/20/1976  . MAMMOGRAM  11/06/2018  . INFLUENZA VACCINE  01/29/2019    There are no preventive care reminders to display for this patient.  Lab Results  Component Value Date   TSH 0.009 (L) 11/29/2018   Lab Results  Component Value Date   WBC 6.4 11/03/2018   HGB 13.7 11/03/2018   HCT 40.8 11/03/2018   MCV 92 11/03/2018   PLT 252 11/03/2018   Lab Results   Component Value Date   NA 136 11/03/2018   K 4.5 11/03/2018   CO2 22 11/03/2018   GLUCOSE 104 (H) 11/03/2018   BUN 11 11/03/2018   CREATININE 0.69 11/03/2018   BILITOT 0.3 11/03/2018   ALKPHOS 81 11/03/2018   AST 15 11/03/2018   ALT 14 11/03/2018   PROT 7.2 11/03/2018   ALBUMIN 4.3 11/03/2018   CALCIUM 9.9 11/03/2018   ANIONGAP 15 02/12/2014   GFR 97.38 03/28/2014   Lab Results  Component Value Date   CHOL 193 11/03/2018   Lab Results  Component Value Date   HDL 48 11/03/2018   Lab Results  Component Value Date   LDLCALC 124 (H) 11/03/2018   Lab Results  Component Value Date   TRIG 106 11/03/2018   Lab Results  Component Value Date   CHOLHDL 4.0 11/03/2018   Lab Results  Component Value Date   HGBA1C 5.7 (A) 11/03/2018   Assessment & Plan:   1. Hypertension, unspecified type The current medical regimen is effective; blood pressure is stable at 126/84 today; continue present plan and medications as prescribed. She will continue to decrease high sodium intake, excessive alcohol intake, increase potassium intake, smoking cessation, and increase physical activity of at least 30 minutes of cardio activity daily. She will continue to follow Heart Healthy or DASH diet.  2. Abnormal urinalysis Results are pending.  - Urine Culture  3. Breast cancer screening - MM DIGITAL SCREENING BILATERAL  4. Hypothyroidism, unspecified type Patient has sensitivity to Synthroid 150 mcg. We will initiate Thyroid 90 mg today. Lengthy discussion concerning allergies to Levothyroxine explained to patient who is advised to report any signs or symptoms of allergic reaction to this new med. Patient verifies understanding.  - thyroid (ARMOUR THYROID) 90 MG tablet; Take 1 tablet (90 mg total) by mouth daily.  Dispense: 30 tablet; Refill: 1  5. Yeast infection We will initiate Fluconazole today.  - fluconazole (DIFLUCAN) 150  MG tablet; Take 1 tablet (150 mg total) by mouth once for 1  dose.  Dispense: 1 tablet; Refill: 0  6. Follow up She will follow up in 1 month to assess effectiveness of Armour. She will follow up in 6 months thereafter. - POCT urinalysis dipstick   Meds ordered this encounter  Medications  . fluconazole (DIFLUCAN) 150 MG tablet    Sig: Take 1 tablet (150 mg total) by mouth once for 1 dose.    Dispense:  1 tablet    Refill:  0  . thyroid (ARMOUR THYROID) 90 MG tablet    Sig: Take 1 tablet (90 mg total) by mouth daily.    Dispense:  30 tablet    Refill:  1    Orders Placed This Encounter  Procedures  . Urine Culture  . MM DIGITAL SCREENING BILATERAL  . POCT urinalysis dipstick    Referral Orders  No referral(s) requested today    [name],  MSN, FNP-BC [address] [place], [place] [address] [phone]- fax   Problem List Items Addressed This Visit      Cardiovascular and Mediastinum   Hypertension - Primary    Other Visit Diagnoses    Abnormal urinalysis       Relevant Orders   Urine Culture   Breast cancer screening       Relevant Orders   MM DIGITAL SCREENING BILATERAL   Hypothyroidism, unspecified type       Relevant Medications   thyroid (ARMOUR THYROID) 90 MG tablet   Yeast infection       Follow up       Relevant Orders   POCT urinalysis dipstick (Completed)      Meds ordered this encounter  Medications  . fluconazole (DIFLUCAN) 150 MG tablet    Sig: Take 1 tablet (150 mg total) by mouth once for 1 dose.    Dispense:  1 tablet    Refill:  0  . thyroid (ARMOUR THYROID) 90 MG tablet    Sig: Take 1 tablet (90 mg total) by mouth daily.    Dispense:  30 tablet    Refill:  1    Follow-up: Return in about 6 months (around 08/10/2019).    Azzie Glatter, FNP

## 2019-02-08 ENCOUNTER — Ambulatory Visit
Admission: RE | Admit: 2019-02-08 | Discharge: 2019-02-08 | Disposition: A | Discharge status: Home / Self Care | Payer: Medicare HMO | Source: Ambulatory Visit | Attending: Family Medicine | Admitting: Family Medicine

## 2019-02-08 ENCOUNTER — Encounter: Payer: Self-pay | Admitting: Family Medicine

## 2019-02-08 DIAGNOSIS — Z1231 Encounter for screening mammogram for malignant neoplasm of breast: Secondary | ICD-10-CM | POA: Diagnosis not present

## 2019-02-08 DIAGNOSIS — E039 Hypothyroidism, unspecified: Secondary | ICD-10-CM | POA: Insufficient documentation

## 2019-02-08 MED ORDER — THYROID 90 MG PO TABS: 90.0000 mg | ORAL_TABLET | Freq: Every day | ORAL | 1 refills | Status: DC

## 2019-02-09 LAB — URINE CULTURE: Organism ID, Bacteria: NO GROWTH

## 2019-03-11 ENCOUNTER — Encounter: Payer: Self-pay | Admitting: Family Medicine

## 2019-03-11 ENCOUNTER — Ambulatory Visit (INDEPENDENT_AMBULATORY_CARE_PROVIDER_SITE_OTHER): Payer: Medicare HMO | Admitting: Family Medicine

## 2019-03-11 ENCOUNTER — Other Ambulatory Visit: Payer: Self-pay

## 2019-03-11 VITALS — BP 113/69 | HR 84 | Temp 97.9°F | Ht 66.5 in | Wt 236.0 lb

## 2019-03-11 DIAGNOSIS — Z09 Encounter for follow-up examination after completed treatment for conditions other than malignant neoplasm: Secondary | ICD-10-CM

## 2019-03-11 DIAGNOSIS — Z1239 Encounter for other screening for malignant neoplasm of breast: Secondary | ICD-10-CM | POA: Diagnosis not present

## 2019-03-11 DIAGNOSIS — E039 Hypothyroidism, unspecified: Secondary | ICD-10-CM

## 2019-03-11 DIAGNOSIS — I10 Essential (primary) hypertension: Secondary | ICD-10-CM

## 2019-03-11 NOTE — Progress Notes (Signed)
Patient [facility]   Established Patient Office Visit  Subjective:  Patient ID: Julia [name], female    DOB: 1956-05-05  Age: 63 y.o. MRN: [id number]  CC:  Chief Complaint  Patient presents with  . Follow-up    1 month follow up from thyroid , pt has no concern     HPI Julia [name] is a 63 year old female who presents for Follow Up today.   Past Medical History:  Diagnosis Date  . Allergy   . Anxiety   . CHF (congestive heart failure) ([place])   . GERD (gastroesophageal reflux disease)   . Hypertension   . Hypothyroidism 11/2018  . Mitral regurgitation    a. severe by ECHO  01/2014  . Systolic CHF ([place])    a. 2D ECHO: 02/09/2014; EF 35-30%, mild LV dilation, severe, diffuse hypokinesis, regional WMAs cannot be excluded. Ventricular septum with mild dyssynergy c/w LBBB, mild AR, severe MR, severely dilated LA.  . Tobacco abuse    Current Status: Since her last office visit, she is doing well with no complaints. She has not been taking her Thyroid medication lately. She denies any pain with swallowing, size increase, additional palpable nodules, pain, or swelling. She denies visual changes, chest pain, cough, shortness of breath, heart palpitations, and falls. She has occasional headaches and dizziness with position changes. Denies severe headaches, confusion, seizures, double vision, and blurred vision, nausea and vomiting.  She denies fevers, chills, recent infections, weight loss, and night sweats.  No reports of GI problems such as diarrhea, and constipation. She has no reports of blood in stools, dysuria and hematuria. No depression or anxiety reported. she denies pain today.   Past Surgical History:  Procedure Laterality Date  . ABDOMINAL HYSTERECTOMY     partial  . CARDIAC CATHETERIZATION  01/2014   Normal coronaries. EF of 35 to 40%  . DIAGNOSTIC LAPAROSCOPY     to remove fibrioid tumor  . LEFT HEART CATHETERIZATION WITH  CORONARY ANGIOGRAM N/A 02/10/2014   Procedure: LEFT HEART CATHETERIZATION WITH CORONARY ANGIOGRAM;  Surgeon: Sinclair Grooms, MD;  Location: [facility] CATH LAB;  Service: Cardiovascular;  Laterality: N/A;  . MOUTH SURGERY N/A 2017  . TUBAL LIGATION      Family History  Problem Relation Age of Onset  . Diabetes Mother   . Hypertension Mother   . Diabetes Father   . Heart disease Father   . Diabetes Sister   . Drug abuse Sister   . Hypertension Sister   . Mental illness Sister   . Stroke Sister   . Miscarriages / Stillbirths Sister   . Alcohol abuse Brother   . Cancer Brother   . Alcohol abuse Maternal Aunt   . Alcohol abuse Maternal Uncle   . Alcohol abuse Paternal Uncle   . Cancer Paternal Uncle   . Colon cancer Neg Hx   . Colon polyps Neg Hx     Social History   Socioeconomic History  . Marital status: Single    Spouse name: Not on file  . Number of children: Not on file  . Years of education: Not on file  . Highest education level: Not on file  Occupational History  . Not on file  Social Needs  . Financial resource strain: Not on file  . Food insecurity    Worry: Not on file    Inability: Not on file  . Transportation needs    Medical:  Not on file    Non-medical: Not on file  Tobacco Use  . Smoking status: Former Smoker    Packs/day: 0.50    Types: Cigarettes    Quit date: 02/15/2014    Years since quitting: 5.0  . Smokeless tobacco: Never Used  Substance and Sexual Activity  . Alcohol use: No    Alcohol/week: 0.0 standard drinks  . Drug use: No  . Sexual activity: Yes  Lifestyle  . Physical activity    Days per week: Not on file    Minutes per session: Not on file  . Stress: Not on file  Relationships  . Social Herbalist on phone: Not on file    Gets together: Not on file    Attends religious service: Not on file    Active member of club or organization: Not on file    Attends meetings of clubs or organizations: Not on file    Relationship  status: Not on file  . Intimate partner violence    Fear of current or ex partner: Not on file    Emotionally abused: Not on file    Physically abused: Not on file    Forced sexual activity: Not on file  Other Topics Concern  . Not on file  Social History Narrative  . Not on file    Outpatient Medications Prior to Visit  Medication Sig Dispense Refill  . carvedilol (COREG) 6.25 MG tablet TAKE 1 & 1/2 (ONE & ONE-HALF) TABLETS BY MOUTH TWICE DAILY WITH MEALS 90 tablet 3  . furosemide (LASIX) 40 MG tablet TAKE 1/2 (ONE-HALF) TABLET BY MOUTH ONCE DAILY 45 tablet 5  . ipratropium (ATROVENT) 0.03 % nasal spray Place 2 sprays into both nostrils 2 (two) times daily. 30 mL 0  . losartan (COZAAR) 50 MG tablet Take 1 tablet by mouth once daily 90 tablet 1  . omeprazole (PRILOSEC) 20 MG capsule Take 1 capsule (20 mg total) by mouth daily. 30 capsule 6  . spironolactone (ALDACTONE) 25 MG tablet Take 1 tablet by mouth once daily 90 tablet 1  . Vitamin D, Ergocalciferol, (DRISDOL) 1.25 MG (50000 UT) CAPS capsule Take 1 capsule (50,000 Units total) by mouth every 7 (seven) days. 5 capsule 3  . thyroid (ARMOUR THYROID) 90 MG tablet Take 1 tablet (90 mg total) by mouth daily. (Patient not taking: Reported on 03/11/2019) 30 tablet 1   Facility-Administered Medications Prior to Visit  Medication Dose Route Frequency Provider Last Rate Last Dose  . 0.9 %  sodium chloride infusion  500 mL Intravenous Continuous Nandigam, Venia Minks, MD        Allergies  Allergen Reactions  . Synthroid [Levothyroxine Sodium]     Lip swelling and itching.     ROS Review of Systems  Constitutional: Negative.   HENT: Negative.   Eyes: Negative.   Respiratory: Negative.   Cardiovascular: Negative.   Gastrointestinal: Positive for abdominal distention (obese).  Endocrine: Negative.   Genitourinary: Negative.   Musculoskeletal: Positive for arthralgias (generalized).  Skin: Negative.   Allergic/Immunologic: Negative.    Neurological: Negative.   Hematological: Negative.   Psychiatric/Behavioral: Negative.       Objective:    Physical Exam  Constitutional: She is oriented to person, place, and time. She appears well-developed and well-nourished.  HENT:  Head: Normocephalic and atraumatic.  Eyes: Conjunctivae are normal.  Neck: Normal range of motion. Neck supple.  Cardiovascular: Normal rate, normal heart sounds and intact distal pulses.  Pulmonary/Chest: Effort  normal and breath sounds normal.  Abdominal: Soft. Bowel sounds are normal.  Musculoskeletal: Normal range of motion.  Neurological: She is alert and oriented to person, place, and time. She has normal reflexes.  Skin: Skin is warm and dry.  Psychiatric: She has a normal mood and affect. Her behavior is normal. Judgment and thought content normal.  Nursing note and vitals reviewed.   BP 113/69 (BP Location: Right Arm, Patient Position: Sitting, Cuff Size: Normal)   Pulse 84   Temp 97.9 F (36.6 C) (Oral)   Ht 5' 6.5" (1.689 m)   Wt 236 lb (107 kg)   SpO2 97%   BMI 37.52 kg/m  Wt Readings from Last 3 Encounters:  03/11/19 236 lb (107 kg)  02/07/19 232 lb (105.2 kg)  11/29/18 228 lb (103.4 kg)     Health Maintenance Due  Topic Date Due  . PAP SMEAR-Modifier  09/20/1976  . INFLUENZA VACCINE  01/29/2019    There are no preventive care reminders to display for this patient.  Lab Results  Component Value Date   TSH 0.009 (L) 11/29/2018   Lab Results  Component Value Date   WBC 6.4 11/03/2018   HGB 13.7 11/03/2018   HCT 40.8 11/03/2018   MCV 92 11/03/2018   PLT 252 11/03/2018   Lab Results  Component Value Date   NA 136 11/03/2018   K 4.5 11/03/2018   CO2 22 11/03/2018   GLUCOSE 104 (H) 11/03/2018   BUN 11 11/03/2018   CREATININE 0.69 11/03/2018   BILITOT 0.3 11/03/2018   ALKPHOS 81 11/03/2018   AST 15 11/03/2018   ALT 14 11/03/2018   PROT 7.2 11/03/2018   ALBUMIN 4.3 11/03/2018   CALCIUM 9.9 11/03/2018    ANIONGAP 15 02/12/2014   GFR 97.38 03/28/2014   Lab Results  Component Value Date   CHOL 193 11/03/2018   Lab Results  Component Value Date   HDL 48 11/03/2018   Lab Results  Component Value Date   LDLCALC 124 (H) 11/03/2018   Lab Results  Component Value Date   TRIG 106 11/03/2018   Lab Results  Component Value Date   CHOLHDL 4.0 11/03/2018   Lab Results  Component Value Date   HGBA1C 5.7 (A) 11/03/2018      Assessment & Plan:   1. Essential hypertension, malignant The current medical regimen is effective; blood pressure is stable at 113/69 today; continue present plan and medications as prescribed.   2. Breast cancer  3. Hypothyroidism, unspecified type  4. Follow up Follow up in 6 months.    No orders of the defined types were placed in this encounter.  No orders of the defined types were placed in this encounter.   Referral Orders  No referral(s) requested today    [name],  MSN, FNP-BC [facility] [address] [place], [place] [address] [phone]- fax  Problem List Items Addressed This Visit      Cardiovascular and Mediastinum   Essential hypertension, malignant   Hypertension - Primary     Endocrine   Hypothyroidism    Other Visit Diagnoses    Follow up          No orders of the defined types were placed in this encounter.   Follow-up: Return in about 6 months (around 09/08/2019).    Azzie Glatter, FNP

## 2019-03-30 ENCOUNTER — Other Ambulatory Visit: Payer: Self-pay | Admitting: Internal Medicine

## 2019-04-11 ENCOUNTER — Encounter: Payer: Self-pay | Admitting: Family Medicine

## 2019-04-11 ENCOUNTER — Other Ambulatory Visit: Payer: Self-pay | Admitting: Family Medicine

## 2019-04-11 ENCOUNTER — Telehealth: Payer: Self-pay | Admitting: Family Medicine

## 2019-04-11 DIAGNOSIS — E559 Vitamin D deficiency, unspecified: Secondary | ICD-10-CM

## 2019-04-11 MED ORDER — VITAMIN D (ERGOCALCIFEROL) 1.25 MG (50000 UNIT) PO CAPS: 50000.0000 [IU] | ORAL_CAPSULE | ORAL | 6 refills | Status: DC

## 2019-04-11 NOTE — Telephone Encounter (Signed)
Called, spoke with patient. Advised that vitamin D has been sent to pharmacy. Thanks!

## 2019-04-11 NOTE — Telephone Encounter (Signed)
Patient has a requested a refill of Vit D.  Please advise.

## 2019-05-06 ENCOUNTER — Ambulatory Visit: Payer: Medicare HMO | Admitting: Family Medicine

## 2019-05-10 ENCOUNTER — Other Ambulatory Visit: Payer: Self-pay | Admitting: Cardiovascular Disease

## 2019-05-12 ENCOUNTER — Other Ambulatory Visit: Payer: Self-pay | Admitting: Family Medicine

## 2019-05-12 DIAGNOSIS — E039 Hypothyroidism, unspecified: Secondary | ICD-10-CM

## 2019-05-24 ENCOUNTER — Ambulatory Visit (INDEPENDENT_AMBULATORY_CARE_PROVIDER_SITE_OTHER): Payer: Medicare HMO | Admitting: Family Medicine

## 2019-05-24 ENCOUNTER — Encounter: Payer: Self-pay | Admitting: Family Medicine

## 2019-05-24 ENCOUNTER — Other Ambulatory Visit: Payer: Self-pay

## 2019-05-24 VITALS — BP 125/76 | HR 66 | Temp 97.7°F | Ht 66.0 in | Wt 265.0 lb

## 2019-05-24 DIAGNOSIS — I10 Essential (primary) hypertension: Secondary | ICD-10-CM

## 2019-05-24 DIAGNOSIS — Z09 Encounter for follow-up examination after completed treatment for conditions other than malignant neoplasm: Secondary | ICD-10-CM | POA: Diagnosis not present

## 2019-05-24 DIAGNOSIS — E039 Hypothyroidism, unspecified: Secondary | ICD-10-CM | POA: Diagnosis not present

## 2019-05-24 DIAGNOSIS — K219 Gastro-esophageal reflux disease without esophagitis: Secondary | ICD-10-CM

## 2019-05-24 DIAGNOSIS — Z0000 Encounter for general adult medical examination without abnormal findings: Secondary | ICD-10-CM

## 2019-05-24 LAB — POCT URINALYSIS DIPSTICK
Bilirubin, UA: NEGATIVE
Glucose, UA: NEGATIVE
Ketones, UA: NEGATIVE
Leukocytes, UA: NEGATIVE
Nitrite, UA: NEGATIVE
Protein, UA: NEGATIVE
Spec Grav, UA: 1.025 (ref 1.010–1.025)
Urobilinogen, UA: 1 E.U./dL
pH, UA: 7 (ref 5.0–8.0)

## 2019-05-24 MED ORDER — OMEPRAZOLE 20 MG PO CPDR: 20.0000 mg | DELAYED_RELEASE_CAPSULE | Freq: Two times a day (BID) | ORAL | 6 refills | Status: DC

## 2019-05-24 NOTE — Progress Notes (Signed)
Patient [facility]   Established Patient Office Visit  Subjective:  Patient ID: Julia [name], female    DOB: 1955/07/06  Age: 64 y.o. MRN: [id number]  CC:  Chief Complaint  Patient presents with  . GI Problem    "feels like gas in upper left breast area"  . Forms Request    Disability  Parking Placard    HPI Julia [name] is a 63 year old female who presents for Follow Up today.   Past Medical History:  Diagnosis Date  . Allergy   . Anxiety   . CHF (congestive heart failure) ([place])   . GERD (gastroesophageal reflux disease)   . Hypertension   . Hypothyroidism 11/2018  . Mitral regurgitation    a. severe by ECHO  01/2014  . Systolic CHF ([place])    a. 2D ECHO: 02/09/2014; EF 35-30%, mild LV dilation, severe, diffuse hypokinesis, regional WMAs cannot be excluded. Ventricular septum with mild dyssynergy c/w LBBB, mild AR, severe MR, severely dilated LA.  . Tobacco abuse   . Vitamin D deficiency    Current Status: Since her last office visit, she has c/o 'indigestion' X 1 week now. She states that she feels the need to burp often. She has noted that her symptoms increase when she eats certain. No reports of GI problems such as diarrhea, and constipation. She has no reports of blood in stools, dysuria and hematuria. She has increased her physical activity lately. She denies visual changes, chest pain, cough, shortness of breath, heart palpitations, and falls. She has occasional headaches and dizziness with position changes. Denies severe headaches, confusion, seizures, double vision, and blurred vision, nausea and vomiting. Her anxiety is mild today. She denies suicidal ideations, homicidal ideations, or auditory hallucinations. She denies fevers, chills, recent infections, weight loss, and night sweats. She denies pain today.   Past Surgical History:  Procedure Laterality Date  . ABDOMINAL HYSTERECTOMY     partial  . CARDIAC  CATHETERIZATION  01/2014   Normal coronaries. EF of 35 to 40%  . DIAGNOSTIC LAPAROSCOPY     to remove fibrioid tumor  . LEFT HEART CATHETERIZATION WITH CORONARY ANGIOGRAM N/A 02/10/2014   Procedure: LEFT HEART CATHETERIZATION WITH CORONARY ANGIOGRAM;  Surgeon: Sinclair Grooms, MD;  Location[facility] CATH LAB;  Service: Cardiovascular;  Laterality: N/A;  . MOUTH SURGERY N/A 2017  . TUBAL LIGATION      Family History  Problem Relation Age of Onset  . Diabetes Mother   . Hypertension Mother   . Diabetes Father   . Heart disease Father   . Diabetes Sister   . Drug abuse Sister   . Hypertension Sister   . Mental illness Sister   . Stroke Sister   . Miscarriages / Stillbirths Sister   . Alcohol abuse Brother   . Cancer Brother   . Alcohol abuse Maternal Aunt   . Alcohol abuse Maternal Uncle   . Alcohol abuse Paternal Uncle   . Cancer Paternal Uncle   . Colon cancer Neg Hx   . Colon polyps Neg Hx     Social History   Socioeconomic History  . Marital status: Single    Spouse name: Not on file  . Number of children: Not on file  . Years of education: Not on file  . Highest education level: Not on file  Occupational History  . Not on file  Social Needs  . Financial resource strain: Not on  file  . Food insecurity    Worry: Not on file    Inability: Not on file  . Transportation needs    Medical: Not on file    Non-medical: Not on file  Tobacco Use  . Smoking status: Former Smoker    Packs/day: 0.50    Types: Cigarettes    Quit date: 02/15/2014    Years since quitting: 5.2  . Smokeless tobacco: Never Used  Substance and Sexual Activity  . Alcohol use: No    Alcohol/week: 0.0 standard drinks  . Drug use: No  . Sexual activity: Yes  Lifestyle  . Physical activity    Days per week: Not on file    Minutes per session: Not on file  . Stress: Not on file  Relationships  . Social Herbalist on phone: Not on file    Gets together: Not on file    Attends  religious service: Not on file    Active member of club or organization: Not on file    Attends meetings of clubs or organizations: Not on file    Relationship status: Not on file  . Intimate partner violence    Fear of current or ex partner: Not on file    Emotionally abused: Not on file    Physically abused: Not on file    Forced sexual activity: Not on file  Other Topics Concern  . Not on file  Social History Narrative  . Not on file    Outpatient Medications Prior to Visit  Medication Sig Dispense Refill  . carvedilol (COREG) 6.25 MG tablet TAKE 1 & 1/2 (ONE & ONE-HALF) TABLETS BY MOUTH TWICE DAILY WITH MEALS 90 tablet 0  . furosemide (LASIX) 40 MG tablet Take 1/2 (one-half) tablet by mouth once daily 45 tablet 0  . ipratropium (ATROVENT) 0.03 % nasal spray Place 2 sprays into both nostrils 2 (two) times daily. 30 mL 0  . losartan (COZAAR) 50 MG tablet Take 1 tablet by mouth once daily 90 tablet 1  . NP THYROID 90 MG tablet Take 1 tablet by mouth once daily 30 tablet 0  . spironolactone (ALDACTONE) 25 MG tablet Take 1 tablet by mouth once daily 90 tablet 1  . Vitamin D, Ergocalciferol, (DRISDOL) 1.25 MG (50000 UT) CAPS capsule Take 1 capsule (50,000 Units total) by mouth every 7 (seven) days. 5 capsule 6  . omeprazole (PRILOSEC) 20 MG capsule Take 1 capsule (20 mg total) by mouth daily. 30 capsule 6   Facility-Administered Medications Prior to Visit  Medication Dose Route Frequency Provider Last Rate Last Dose  . 0.9 %  sodium chloride infusion  500 mL Intravenous Continuous Nandigam, Venia Minks, MD        Allergies  Allergen Reactions  . Synthroid [Levothyroxine Sodium]     Lip swelling and itching.     ROS Review of Systems  Constitutional: Negative.   HENT: Negative.   Eyes: Negative.   Respiratory: Negative.   Cardiovascular: Negative.   Gastrointestinal: Positive for abdominal distention.  Endocrine: Negative.   Genitourinary: Negative.   Musculoskeletal:  Negative.   Skin: Negative.   Neurological: Positive for dizziness (occasional) and headaches (occasional ).  Hematological: Negative.   Psychiatric/Behavioral: Negative.    Objective:    Physical Exam  Constitutional: She is oriented to person, place, and time. She appears well-developed and well-nourished.  HENT:  Head: Normocephalic and atraumatic.  Eyes: Conjunctivae are normal.  Neck: Normal range of motion. Neck supple.  Cardiovascular: Normal rate, regular rhythm, normal heart sounds and intact distal pulses.  Pulmonary/Chest: Effort normal and breath sounds normal.  Abdominal: Soft. Bowel sounds are normal. She exhibits distension (occasional ).  Musculoskeletal: Normal range of motion.  Neurological: She is alert and oriented to person, place, and time. She has normal reflexes.  Skin: Skin is warm and dry.  Psychiatric: She has a normal mood and affect. Her behavior is normal. Judgment and thought content normal.  Nursing note and vitals reviewed.   BP 125/76   Pulse 66   Temp 97.7 F (36.5 C) (Oral)   Ht 5\' 6"  (1.676 m)   Wt 265 lb (120.2 kg)   SpO2 98%   BMI 42.77 kg/m  Wt Readings from Last 3 Encounters:  05/24/19 265 lb (120.2 kg)  03/11/19 236 lb (107 kg)  02/07/19 232 lb (105.2 kg)     Health Maintenance Due  Topic Date Due  . PAP SMEAR-Modifier  09/20/1976  . INFLUENZA VACCINE  01/29/2019    There are no preventive care reminders to display for this patient.  Lab Results  Component Value Date   TSH 0.009 (L) 11/29/2018   Lab Results  Component Value Date   WBC 6.4 11/03/2018   HGB 13.7 11/03/2018   HCT 40.8 11/03/2018   MCV 92 11/03/2018   PLT 252 11/03/2018   Lab Results  Component Value Date   NA 136 11/03/2018   K 4.5 11/03/2018   CO2 22 11/03/2018   GLUCOSE 104 (H) 11/03/2018   BUN 11 11/03/2018   CREATININE 0.69 11/03/2018   BILITOT 0.3 11/03/2018   ALKPHOS 81 11/03/2018   AST 15 11/03/2018   ALT 14 11/03/2018   PROT 7.2  11/03/2018   ALBUMIN 4.3 11/03/2018   CALCIUM 9.9 11/03/2018   ANIONGAP 15 02/12/2014   GFR 97.38 03/28/2014   Lab Results  Component Value Date   CHOL 193 11/03/2018   Lab Results  Component Value Date   HDL 48 11/03/2018   Lab Results  Component Value Date   LDLCALC 124 (H) 11/03/2018   Lab Results  Component Value Date   TRIG 106 11/03/2018   Lab Results  Component Value Date   CHOLHDL 4.0 11/03/2018   Lab Results  Component Value Date   HGBA1C 5.7 (A) 11/03/2018    Assessment & Plan:   1. Hypertension, unspecified type The current medical regimen is effective; blood pressure is stable at 125/76 today; continue present plan and medications as prescribed. She will continue to take medications as prescribed, to decrease high sodium intake, excessive alcohol intake, increase potassium intake, smoking cessation, and increase physical activity of at least 30 minutes of cardio activity daily. She will continue to follow Heart Healthy or DASH diet.  2. Gastroesophageal reflux disease without esophagitis Moderated. We will increase Omeprazole to BID today. - omeprazole (PRILOSEC) 20 MG capsule; Take 1 capsule (20 mg total) by mouth 2 (two) times daily before a meal. As needed.  Dispense: 60 capsule; Refill: 6  3. Hypothyroidism, unspecified type  4. Healthcare maintenance - Urinalysis Dipstick  5. Follow up She will follow up in 10/2019.   Meds ordered this encounter  Medications  . omeprazole (PRILOSEC) 20 MG capsule    Sig: Take 1 capsule (20 mg total) by mouth 2 (two) times daily before a meal. As needed.    Dispense:  60 capsule    Refill:  6    Orders Placed This Encounter  Procedures  . Urinalysis  Dipstick    Referral Orders  No referral(s) requested today    [name],  MSN, FNP-BC [place],  [id number] [phone]  fax   Problem List Items Addressed This Visit      Cardiovascular and Mediastinum   Hypertension - Primary     Digestive   Gastroesophageal reflux disease without esophagitis   Relevant Medications   omeprazole (PRILOSEC) 20 MG capsule     Endocrine   Hypothyroidism    Other Visit Diagnoses    Healthcare maintenance       Relevant Orders   Urinalysis Dipstick   Follow up          Meds ordered this encounter  Medications  . omeprazole (PRILOSEC) 20 MG capsule    Sig: Take 1 capsule (20 mg total) by mouth 2 (two) times daily before a meal. As needed.    Dispense:  60 capsule    Refill:  6    Follow-up: No follow-ups on file.    Azzie Glatter, FNP

## 2019-06-12 ENCOUNTER — Other Ambulatory Visit: Payer: Self-pay | Admitting: Cardiovascular Disease

## 2019-06-12 ENCOUNTER — Other Ambulatory Visit: Payer: Self-pay | Admitting: Family Medicine

## 2019-06-12 DIAGNOSIS — E039 Hypothyroidism, unspecified: Secondary | ICD-10-CM

## 2019-06-14 NOTE — Telephone Encounter (Signed)
Rx has been sent to the pharmacy electronically. ° °

## 2019-06-26 ENCOUNTER — Other Ambulatory Visit: Payer: Self-pay | Admitting: Family Medicine

## 2019-06-29 ENCOUNTER — Emergency Department (HOSPITAL_COMMUNITY)
Admission: EM | Admit: 2019-06-29 | Discharge: 2019-06-30 | Disposition: A | Discharge status: Home / Self Care | Payer: Medicare HMO | Attending: Emergency Medicine | Admitting: Emergency Medicine

## 2019-06-29 ENCOUNTER — Other Ambulatory Visit: Payer: Self-pay

## 2019-06-29 ENCOUNTER — Encounter (HOSPITAL_COMMUNITY): Payer: Self-pay

## 2019-06-29 ENCOUNTER — Telehealth: Payer: Self-pay | Admitting: Family Medicine

## 2019-06-29 ENCOUNTER — Emergency Department (HOSPITAL_COMMUNITY): Payer: Medicare HMO

## 2019-06-29 DIAGNOSIS — I110 Hypertensive heart disease with heart failure: Secondary | ICD-10-CM | POA: Insufficient documentation

## 2019-06-29 DIAGNOSIS — E039 Hypothyroidism, unspecified: Secondary | ICD-10-CM | POA: Diagnosis not present

## 2019-06-29 DIAGNOSIS — R0789 Other chest pain: Secondary | ICD-10-CM | POA: Diagnosis not present

## 2019-06-29 DIAGNOSIS — Z87891 Personal history of nicotine dependence: Secondary | ICD-10-CM | POA: Diagnosis not present

## 2019-06-29 DIAGNOSIS — R0781 Pleurodynia: Secondary | ICD-10-CM | POA: Diagnosis not present

## 2019-06-29 DIAGNOSIS — Z79899 Other long term (current) drug therapy: Secondary | ICD-10-CM | POA: Insufficient documentation

## 2019-06-29 DIAGNOSIS — I5020 Unspecified systolic (congestive) heart failure: Secondary | ICD-10-CM | POA: Insufficient documentation

## 2019-06-29 LAB — CBC WITH DIFFERENTIAL/PLATELET
Abs Immature Granulocytes: 0.01 10*3/uL (ref 0.00–0.07)
Basophils Absolute: 0 10*3/uL (ref 0.0–0.1)
Basophils Relative: 0 %
Eosinophils Absolute: 0.1 10*3/uL (ref 0.0–0.5)
Eosinophils Relative: 2 %
HCT: 43.9 % (ref 36.0–46.0)
Hemoglobin: 13.9 g/dL (ref 12.0–15.0)
Immature Granulocytes: 0 %
Lymphocytes Relative: 45 %
Lymphs Abs: 2.6 10*3/uL (ref 0.7–4.0)
MCH: 30.6 pg (ref 26.0–34.0)
MCHC: 31.7 g/dL (ref 30.0–36.0)
MCV: 96.7 fL (ref 80.0–100.0)
Monocytes Absolute: 0.3 10*3/uL (ref 0.1–1.0)
Monocytes Relative: 6 %
Neutro Abs: 2.7 10*3/uL (ref 1.7–7.7)
Neutrophils Relative %: 47 %
Platelets: 218 10*3/uL (ref 150–400)
RBC: 4.54 MIL/uL (ref 3.87–5.11)
RDW: 12.5 % (ref 11.5–15.5)
WBC: 5.7 10*3/uL (ref 4.0–10.5)
nRBC: 0 % (ref 0.0–0.2)

## 2019-06-29 LAB — COMPREHENSIVE METABOLIC PANEL
ALT: 18 U/L (ref 0–44)
AST: 20 U/L (ref 15–41)
Albumin: 3.5 g/dL (ref 3.5–5.0)
Alkaline Phosphatase: 60 U/L (ref 38–126)
Anion gap: 7 (ref 5–15)
BUN: 11 mg/dL (ref 8–23)
CO2: 26 mmol/L (ref 22–32)
Calcium: 9.2 mg/dL (ref 8.9–10.3)
Chloride: 107 mmol/L (ref 98–111)
Creatinine, Ser: 0.72 mg/dL (ref 0.44–1.00)
GFR calc Af Amer: >60 mL/min (ref >60)
GFR calc non Af Amer: >60 mL/min (ref >60)
Glucose, Bld: 116 mg/dL — ABNORMAL HIGH (ref 70–99)
Potassium: 4.5 mmol/L (ref 3.5–5.1)
Sodium: 140 mmol/L (ref 135–145)
Total Bilirubin: 0.5 mg/dL (ref 0.3–1.2)
Total Protein: 6.7 g/dL (ref 6.5–8.1)

## 2019-06-29 LAB — TROPONIN I (HIGH SENSITIVITY)
Troponin I (High Sensitivity): 3 ng/L (ref <18)
Troponin I (High Sensitivity): 5 ng/L (ref <18)

## 2019-06-29 NOTE — Telephone Encounter (Signed)
Patient c/o increased left chest gas. She has increased her acid reflux medication with no relief. Pain  Increases with arm movement.   Patient advised to call EMS or to the ED for further evaluation.

## 2019-06-29 NOTE — ED Triage Notes (Signed)
Onset 2 weeks ago pain at lower rib cage area when moving left arm.  Pt has been seen at PCP and Omeprazole was increased with no relief or decrease in pain.  PCP advised pt to come to ED.  NO shortness of breath, cough, or known inury.

## 2019-06-30 ENCOUNTER — Telehealth: Payer: Self-pay | Admitting: Cardiovascular Disease

## 2019-06-30 MED ORDER — DICLOFENAC SODIUM 1 % EX GEL: 2.0000 g | Freq: Four times a day (QID) | CUTANEOUS | 0 refills | Status: AC

## 2019-06-30 NOTE — ED Notes (Signed)
Patient verbalizes understanding of discharge instructions. Opportunity for questioning and answers were provided. Armband removed by staff, pt discharged from ED.  

## 2019-06-30 NOTE — Telephone Encounter (Signed)
Patient went to the ER yesterday because she was having a lingering pain. She had an x-ray done, and she would like Dr. Gwenlyn Found to look at the test to make sure there were no cardiac issues.

## 2019-06-30 NOTE — ED Provider Notes (Signed)
[facility] EMERGENCY DEPARTMENT Provider Note  CSN: [id number] Arrival date & time: 06/29/19 1713  Chief Complaint(s) rib cage pain  HPI Julia [name] is a 63 y.o. female   The history is provided by the patient.  Chest Pain Pain location:  L lateral chest Pain quality: dull   Pain radiates to:  Does not radiate Pain severity:  Severe Duration:  2 weeks Timing:  Intermittent Progression:  Unchanged Chronicity:  New Context: movement   Relieved by: not moving. Worsened by:  Movement (twisting, moving left arm, turning over) Associated symptoms: no anxiety, no back pain, no cough, no fatigue, no fever, no nausea, no shortness of breath and no vomiting     Past Medical History Past Medical History:  Diagnosis Date  . Allergy   . Anxiety   . CHF (congestive heart failure) ([place])   . GERD (gastroesophageal reflux disease)   . Hypertension   . Hypothyroidism 11/2018  . Mitral regurgitation    a. severe by ECHO  01/2014  . Systolic CHF ([place])    a. 2D ECHO: 02/09/2014; EF 35-30%, mild LV dilation, severe, diffuse hypokinesis, regional WMAs cannot be excluded. Ventricular septum with mild dyssynergy c/w LBBB, mild AR, severe MR, severely dilated LA.  . Tobacco abuse   . Vitamin D deficiency    Patient Active Problem List   Diagnosis Date Noted  . Hypothyroidism 02/08/2019  . Vaginal discharge 11/30/2018  . Hyperthyroidism 11/30/2018  . Pain due to dental caries 11/30/2018  . Gastroesophageal reflux disease without esophagitis 11/30/2018  . Hypertension 11/03/2018  . Shortness of breath 11/03/2018  . History of congestive cardiomyopathy 02/12/2014  . Hyperkalemia 02/12/2014  . Normal coronary arteries 02/12/2014  . Mitral regurgitation   . Essential hypertension, malignant 02/08/2014  . LBBB (left bundle branch block) 02/08/2014  . UTERINE FIBROID 08/27/2006   Home Medication(s) Prior to Admission medications   Medication Sig Start Date End  Date Taking? Authorizing Provider  carvedilol (COREG) 6.25 MG tablet TAKE 1 & 1/2 (ONE & ONE-HALF) TABLETS BY MOUTH TWICE DAILY WITH MEALS 06/14/19   Lorretta Harp, MD  diclofenac Sodium (VOLTAREN) 1 % GEL Apply 2 g topically 4 (four) times daily for 14 days. 06/30/19 07/14/19  Fatima Blank, MD  furosemide (LASIX) 40 MG tablet Take 1/2 (one-half) tablet by mouth once daily 06/27/19   Azzie Glatter, FNP  ipratropium (ATROVENT) 0.03 % nasal spray Place 2 sprays into both nostrils 2 (two) times daily. 03/06/17   Scot Jun, FNP  losartan (COZAAR) 50 MG tablet Take 1 tablet by mouth once daily 01/04/19   Lorretta Harp, MD  NP THYROID 90 MG tablet Take 1 tablet by mouth once daily 06/13/19   Azzie Glatter, FNP  omeprazole (PRILOSEC) 20 MG capsule Take 1 capsule (20 mg total) by mouth 2 (two) times daily before a meal. As needed. 05/24/19   Azzie Glatter, FNP  spironolactone (ALDACTONE) 25 MG tablet Take 1 tablet by mouth once daily 01/04/19   Lorretta Harp, MD  Vitamin D, Ergocalciferol, (DRISDOL) 1.25 MG (50000 UT) CAPS capsule Take 1 capsule (50,000 Units total) by mouth every 7 (seven) days. 04/11/19   Azzie Glatter, FNP  Past Surgical History Past Surgical History:  Procedure Laterality Date  . ABDOMINAL HYSTERECTOMY     partial  . CARDIAC CATHETERIZATION  01/2014   Normal coronaries. EF of 35 to 40%  . DIAGNOSTIC LAPAROSCOPY     to remove fibrioid tumor  . LEFT HEART CATHETERIZATION WITH CORONARY ANGIOGRAM N/A 02/10/2014   Procedure: LEFT HEART CATHETERIZATION WITH CORONARY ANGIOGRAM;  Surgeon: Sinclair Grooms, MD;  Location: [facility] CATH LAB;  Service: Cardiovascular;  Laterality: N/A;  . MOUTH SURGERY N/A 2017  . TUBAL LIGATION     Family History Family History  Problem Relation Age of Onset  . Diabetes Mother   .  Hypertension Mother   . Diabetes Father   . Heart disease Father   . Diabetes Sister   . Drug abuse Sister   . Hypertension Sister   . Mental illness Sister   . Stroke Sister   . Miscarriages / Stillbirths Sister   . Alcohol abuse Brother   . Cancer Brother   . Alcohol abuse Maternal Aunt   . Alcohol abuse Maternal Uncle   . Alcohol abuse Paternal Uncle   . Cancer Paternal Uncle   . Colon cancer Neg Hx   . Colon polyps Neg Hx     Social History Social History   Tobacco Use  . Smoking status: Former Smoker    Packs/day: 0.50    Types: Cigarettes    Quit date: 02/15/2014    Years since quitting: 5.3  . Smokeless tobacco: Never Used  Substance Use Topics  . Alcohol use: No    Alcohol/week: 0.0 standard drinks  . Drug use: No   Allergies Synthroid [levothyroxine sodium]  Review of Systems Review of Systems  Constitutional: Negative for fatigue and fever.  Respiratory: Negative for cough and shortness of breath.   Cardiovascular: Positive for chest pain.  Gastrointestinal: Negative for nausea and vomiting.  Musculoskeletal: Negative for back pain.   All other systems are reviewed and are negative for acute change except as noted in the HPI\  Physical Exam Vital Signs  I have reviewed the triage vital signs BP (!) 156/88 (BP Location: Right Arm)   Pulse 66   Temp 98.2 F (36.8 C) (Oral)   Resp 17   SpO2 99%   Physical Exam Vitals reviewed.  Constitutional:      General: She is not in acute distress.    Appearance: She is well-developed. She is not diaphoretic.  HENT:     Head: Normocephalic and atraumatic.     Nose: Nose normal.  Eyes:     General: No scleral icterus.       Right eye: No discharge.        Left eye: No discharge.     Conjunctiva/sclera: Conjunctivae normal.     Pupils: Pupils are equal, round, and reactive to light.  Cardiovascular:     Rate and Rhythm: Normal rate and regular rhythm.     Heart sounds: No murmur. No friction rub. No  gallop.   Pulmonary:     Effort: Pulmonary effort is normal. No respiratory distress.     Breath sounds: Normal breath sounds. No stridor. No rales.  Chest:     Chest wall: Tenderness present.    Abdominal:     General: There is no distension.     Palpations: Abdomen is soft.     Tenderness: There is no abdominal tenderness.  Musculoskeletal:        General: No tenderness.  Cervical back: Normal range of motion and neck supple.  Skin:    General: Skin is warm and dry.     Findings: No erythema or rash.  Neurological:     Mental Status: She is alert and oriented to person, place, and time.     ED Results and Treatments Labs (all labs ordered are listed, but only abnormal results are displayed) Labs Reviewed  COMPREHENSIVE METABOLIC PANEL - Abnormal; Notable for the following components:      Result Value   Glucose, Bld 116 (*)    All other components within normal limits  CBC WITH DIFFERENTIAL/PLATELET  TROPONIN I (HIGH SENSITIVITY)  TROPONIN I (HIGH SENSITIVITY)                                                                                                                         EKG  EKG Interpretation  Date/Time:  Thursday June 30 2019 00:45:57 EST Ventricular Rate:  64 PR Interval:    QRS Duration: 82 QT Interval:  423 QTC Calculation: 437 R Axis:   38 Text Interpretation: Sinus rhythm Abnormal T, probable ischemia, anterior leads, new from prior Confirmed by Addison Lank [phone]) on 06/30/2019 12:50:31 AM      Radiology DG Chest 2 View  Result Date: 06/29/2019 CLINICAL DATA:  Rib cage pain on the left. EXAM: CHEST - 2 VIEW COMPARISON:  February 08, 2014. FINDINGS: The heart size and mediastinal contours are within normal limits. Both lungs are clear. The visualized skeletal structures are unremarkable. There is some mild age-indeterminate height loss of several mid to lower thoracic vertebral bodies. IMPRESSION: No active cardiopulmonary disease.  Electronically Signed   By: Constance Holster M.D.   On: 06/29/2019 19:27    Pertinent labs & imaging results that were available during my care of the patient were reviewed by me and considered in my medical decision making (see chart for details).  Medications Ordered in ED Medications - No data to display                                                                                                                                  Procedures Procedures  (including critical care time)  Medical Decision Making / ED Course I have reviewed the nursing notes for this encounter and the patient's prior records (if available in EHR or on provided paperwork).   Julia [name] was evaluated in  Emergency Department on 06/30/2019 for the symptoms described in the history of present illness. She was evaluated in the context of the global COVID-19 pandemic, which necessitated consideration that the patient might be at risk for infection with the SARS-CoV-2 virus that causes COVID-19. Institutional protocols and algorithms that pertain to the evaluation of patients at risk for COVID-19 are in a state of rapid change based on information released by regulatory bodies including the CDC and federal and state organizations. These policies and algorithms were followed during the patient's care in the ED.  Patient presents with left lateral chest pain.  Most consistent with chest wall pain, either muscle strain/spasm of the intercostal muscles or costochondritis.  It is highly atypical for ACS.  EKG does show new ischemic changes when compared to last EKG tracing from 2015.  However patient has had 2 negative serial troponins.  Given the characteristic of her pain and exam, feel this is sufficient and do not need additional cardiac work-up at this time.  Presentation is not classic for aortic dissection or esophageal perforation.  Low suspicion for pulmonary embolism.  Rest of the labs were grossly reassuring  without leukocytosis or anemia.  No significant lecture light derangements or renal sufficiency.  The patient appears reasonably screened and/or stabilized for discharge and I doubt any other medical condition or other [facility] requiring further screening, evaluation, or treatment in the ED at this time prior to discharge.  The patient is safe for discharge with strict return precautions.     Final Clinical Impression(s) / ED Diagnoses Final diagnoses:  Chest wall pain     The patient appears reasonably screened and/or stabilized for discharge and I doubt any other medical condition or other [facility] requiring further screening, evaluation, or treatment in the ED at this time prior to discharge.  Disposition: Discharge  Condition: Good  I have discussed the results, Dx and Tx plan with the patient who expressed understanding and agree(s) with the plan. Discharge instructions discussed at great length. The patient was given strict return precautions who verbalized understanding of the instructions. No further questions at time of discharge.    ED Discharge Orders         Ordered    diclofenac Sodium (VOLTAREN) 1 % GEL  4 times daily     06/30/19 0108            Follow Up: Lorretta Harp, MD [address] [place] [address] [phone]  Schedule an appointment as soon as possible for a visit  to review new EKG findings and compare with EKG from office.  Azzie Glatter, [place] [address] [phone]  Schedule an appointment as soon as possible for a visit  As needed     This chart was dictated using voice recognition software.  Despite best efforts to proofread,  errors can occur which can change the documentation meaning.   Fatima Blank, MD 06/30/19 [phone]

## 2019-06-30 NOTE — Telephone Encounter (Signed)
Patient stated she went to the ED. An Xray was done (possible pulled muscle). She was given an Rx and [employer]. Advised to follow up with Dr. Gwenlyn Found (Cardio).  Patient advised to see PA if Dr. Gwenlyn Found is not available.

## 2019-06-30 NOTE — Discharge Instructions (Addendum)
You may use over-the-counter Acetaminophen (Tylenol), topical muscle creams such as SalonPas, Icy Hot, Bengay, etc. Please stretch, apply heat, and have massage therapy for additional assistance.  

## 2019-07-01 NOTE — Telephone Encounter (Signed)
She was seen for rib cage pain. CXR was nl, Trop were neg. EKG showed Ant TWI new since prior EKGs when she's had a chronic LBBB. Needs ROV with APP this week to re eval  JJB

## 2019-07-03 ENCOUNTER — Other Ambulatory Visit: Payer: Self-pay | Admitting: Cardiovascular Disease

## 2019-07-03 DIAGNOSIS — I10 Essential (primary) hypertension: Secondary | ICD-10-CM

## 2019-07-03 DIAGNOSIS — E876 Hypokalemia: Secondary | ICD-10-CM

## 2019-07-03 DIAGNOSIS — I5022 Chronic systolic (congestive) heart failure: Secondary | ICD-10-CM

## 2019-07-04 NOTE — Telephone Encounter (Signed)
lmtcb to schedule OV with APP

## 2019-07-06 NOTE — Progress Notes (Signed)
Cardiology Clinic Note   Patient Name: Julia [name] Date of Encounter: 07/07/2019  Primary Care Provider:  Azzie Glatter, FNP Primary Cardiologist:  Quay Burow, MD  Patient Profile    Julia [name] 64 year old female presents today for follow-up of her rib cage pain and EKG change.  Past Medical History    Past Medical History:  Diagnosis Date  . Allergy   . Anxiety   . CHF (congestive heart failure) ([place])   . GERD (gastroesophageal reflux disease)   . Hypertension   . Hypothyroidism 11/2018  . Mitral regurgitation    a. severe by ECHO  01/2014  . Systolic CHF ([place])    a. 2D ECHO: 02/09/2014; EF 35-30%, mild LV dilation, severe, diffuse hypokinesis, regional WMAs cannot be excluded. Ventricular septum with mild dyssynergy c/w LBBB, mild AR, severe MR, severely dilated LA.  . Tobacco abuse   . Vitamin D deficiency    Past Surgical History:  Procedure Laterality Date  . ABDOMINAL HYSTERECTOMY     partial  . CARDIAC CATHETERIZATION  01/2014   Normal coronaries. EF of 35 to 40%  . DIAGNOSTIC LAPAROSCOPY     to remove fibrioid tumor  . LEFT HEART CATHETERIZATION WITH CORONARY ANGIOGRAM N/A 02/10/2014   Procedure: LEFT HEART CATHETERIZATION WITH CORONARY ANGIOGRAM;  Surgeon: Sinclair Grooms, MD;  Location: [facility] CATH LAB;  Service: Cardiovascular;  Laterality: N/A;  . MOUTH SURGERY N/A 2017  . TUBAL LIGATION       Allergies  Allergen Reactions  . Synthroid [Levothyroxine Sodium]     Lip swelling and itching.     History of Present Illness    Ms. Illa has a past medical history of cardiomyopathy, tobacco use, and hypertension.  She has never had a CVA or MI.  She was last seen by Dr. Gwenlyn Found on 01/21/2019.  During that time she denied chest pain but did have some dyspnea with exertion.  She had a remote cardiac catheterization that showed normal coronary arteries and LV dysfunction with an EF of 35%.  Her LVEF ultimately improved to 50% with medication  management.  It was also noted that when she was seen by Dr. Debara Pickett on 06/18/2015 she complained of dyspnea with exertion but still denied chest pain.  She was compliant with her diet and her medications.  Her echocardiogram on 12/10/2017 was normal.  She presents to the emergency department on 06/29/2019 with complaints of chest pain.  This was diagnosed as left chest wall pain/rib cage pain.  Her chest x-ray was normal, troponins were negative, EKG showed anterior T wave inversion which was new and known chronic LBBB.   She presents today for follow-up evaluation and states she had been moving moving furniture prior to her ED visit on 06/29/2019.  She continues to have mild chest wall pain underneath her left breast that is TTP.  She is using diclofenac gel and IcyHot which helps relieve her discomfort.  Her chest x-ray, hospital labs, and EKG were all reviewed with her.  She indicated that her diet consists of high cholesterol foods and she indicates that she will eat a more heart healthy diet and increase her fiber.  She denies chest pain, shortness of breath, lower extremity edema, fatigue, palpitations, melena, hematuria, hemoptysis, diaphoresis, weakness, presyncope, syncope, orthopnea, and PND.   Home Medications    Prior to Admission medications   Medication Sig Start Date End Date Taking? Authorizing Provider  carvedilol (COREG) 6.25 MG tablet TAKE 1 &  1/2 (ONE & ONE-HALF) TABLETS BY MOUTH TWICE DAILY WITH MEALS 06/14/19   Lorretta Harp, MD  diclofenac Sodium (VOLTAREN) 1 % GEL Apply 2 g topically 4 (four) times daily for 14 days. 06/30/19 07/14/19  Fatima Blank, MD  furosemide (LASIX) 40 MG tablet Take 1/2 (one-half) tablet by mouth once daily 06/27/19   Azzie Glatter, FNP  ipratropium (ATROVENT) 0.03 % nasal spray Place 2 sprays into both nostrils 2 (two) times daily. 03/06/17   Scot Jun, FNP  losartan (COZAAR) 50 MG tablet Take 1 tablet by mouth once daily 07/06/19    Lorretta Harp, MD  NP THYROID 90 MG tablet Take 1 tablet by mouth once daily 06/13/19   Azzie Glatter, FNP  omeprazole (PRILOSEC) 20 MG capsule Take 1 capsule (20 mg total) by mouth 2 (two) times daily before a meal. As needed. 05/24/19   Azzie Glatter, FNP  spironolactone (ALDACTONE) 25 MG tablet Take 1 tablet by mouth once daily 07/06/19   Lorretta Harp, MD  Vitamin D, Ergocalciferol, (DRISDOL) 1.25 MG (50000 UT) CAPS capsule Take 1 capsule (50,000 Units total) by mouth every 7 (seven) days. 04/11/19   Azzie Glatter, FNP    Family History    Family History  Problem Relation Age of Onset  . Diabetes Mother   . Hypertension Mother   . Diabetes Father   . Heart disease Father   . Diabetes Sister   . Drug abuse Sister   . Hypertension Sister   . Mental illness Sister   . Stroke Sister   . Miscarriages / Stillbirths Sister   . Alcohol abuse Brother   . Cancer Brother   . Alcohol abuse Maternal Aunt   . Alcohol abuse Maternal Uncle   . Alcohol abuse Paternal Uncle   . Cancer Paternal Uncle   . Colon cancer Neg Hx   . Colon polyps Neg Hx    She indicated that the status of her mother is unknown. She indicated that the status of her father is unknown. She indicated that the status of her sister is unknown. She indicated that the status of her brother is unknown. She indicated that the status of her maternal aunt is unknown. She indicated that the status of her maternal uncle is unknown. She indicated that the status of her paternal uncle is unknown. She indicated that the status of her neg hx is unknown.  Social History    Social History   Socioeconomic History  . Marital status: Single    Spouse name: Not on file  . Number of children: Not on file  . Years of education: Not on file  . Highest education level: Not on file  Occupational History  . Not on file  Tobacco Use  . Smoking status: Former Smoker    Packs/day: 0.50    Types: Cigarettes    Quit date:  02/15/2014    Years since quitting: 5.3  . Smokeless tobacco: Never Used  Substance and Sexual Activity  . Alcohol use: No    Alcohol/week: 0.0 standard drinks  . Drug use: No  . Sexual activity: Yes  Other Topics Concern  . Not on file  Social History Narrative  . Not on file   Social Determinants of Health   Financial Resource Strain:   . Difficulty of Paying Living Expenses: Not on file  Food Insecurity:   . Worried About Charity fundraiser in the Last Year: Not on file  .  Ran Out of Food in the Last Year: Not on file  Transportation Needs:   . Lack of Transportation (Medical): Not on file  . Lack of Transportation (Non-Medical): Not on file  Physical Activity:   . Days of Exercise per Week: Not on file  . Minutes of Exercise per Session: Not on file  Stress:   . Feeling of Stress : Not on file  Social Connections:   . Frequency of Communication with Friends and Family: Not on file  . Frequency of Social Gatherings with Friends and Family: Not on file  . Attends Religious Services: Not on file  . Active Member of Clubs or Organizations: Not on file  . Attends Archivist Meetings: Not on file  . Marital Status: Not on file  Intimate Partner Violence:   . Fear of Current or Ex-Partner: Not on file  . Emotionally Abused: Not on file  . Physically Abused: Not on file  . Sexually Abused: Not on file     Review of Systems    General:  No chills, fever, night sweats or weight changes.  Cardiovascular:  No chest pain, dyspnea on exertion, edema, orthopnea, palpitations, paroxysmal nocturnal dyspnea. Dermatological: No rash, lesions/masses Respiratory: No cough, dyspnea Urologic: No hematuria, dysuria Abdominal:   No nausea, vomiting, diarrhea, bright red blood per rectum, melena, or hematemesis Neurologic:  No visual changes, wkns, changes in mental status. All other systems reviewed and are otherwise negative except as noted above.  Physical Exam    VS:   BP 132/87 (BP Location: Left Arm, Patient Position: Sitting, Cuff Size: Normal)   Pulse 69   Temp 97.6 F (36.4 C)   Wt 235 lb 3.2 oz (106.7 kg)   SpO2 98%   BMI 37.96 kg/m  , BMI Body mass index is 37.96 kg/m. GEN: Well nourished, well developed, in no acute distress. HEENT: normal. Neck: Supple, no JVD, carotid bruits, or masses. Cardiac: RRR, no murmurs, rubs, or gallops. No clubbing, cyanosis, edema.  Radials/DP/PT 2+ and equal bilaterally.  Respiratory:  Respirations regular and unlabored, clear to auscultation bilaterally. GI: Soft, nontender, nondistended, BS + x 4. MS: no deformity or atrophy.  TTP under left breast Skin: warm and dry, no rash. Neuro:  Strength and sensation are intact. Psych: Normal affect.  Accessory Clinical Findings    ECG personally reviewed by me today-sinus bradycardia ST and T wave abnormality inferior and anterior leads 56 bpm.   EKG 06/30/2019 Sinus rhythm probable ischemia in anterior leads 64 bpm   Echocardiogram 12/10/2017 Study Conclusions  - Left ventricle: The cavity size was normal. Systolic function was   normal. The estimated ejection fraction was in the range of 60%   to 65%. Wall motion was normal; there were no regional wall   motion abnormalities. Doppler parameters are consistent with   abnormal left ventricular relaxation (grade 1 diastolic   dysfunction). - Aortic valve: There was mild regurgitation. - Mitral valve: There was mild regurgitation. - Right ventricle: The cavity size was normal. Wall thickness was   normal. Systolic function was normal. - Right atrium: The atrium was normal in size. - Tricuspid valve: There was mild regurgitation. - Pulmonary arteries: Systolic pressure was within the normal   range. - Inferior vena cava: The vessel was normal in size. - Pericardium, extracardiac: There was no pericardial effusion.  DG Chest 2 View  Result Date: 06/29/2019 CLINICAL DATA:  Rib cage pain on the left.  EXAM: CHEST - 2 VIEW COMPARISON:  February 08, 2014. FINDINGS: The heart size and mediastinal contours are within normal limits. Both lungs are clear. The visualized skeletal structures are unremarkable. There is some mild age-indeterminate height loss of several mid to lower thoracic vertebral bodies. IMPRESSION: No active cardiopulmonary disease.  Assessment & Plan   1.  Chest discomfort-EKG today shows sinus bradycardia ST and T wave abnormality inferior and anterior leads 56 bpm..  Recent ED visit on 06/29/2019 with complaints of chest pain.  This was diagnosed as left chest wall pain/rib cage pain.  Her chest x-ray was normal, troponins were negative, EKG showed anterior T wave inversion which was new and known chronic LBBB.  Chest discomfort is chest muscle wall in nature. Repeat EKG  Nonischemic cardiomyopathy-history of coronary arteries with remote cardiac catheterization.  LV dysfunction normalized with medication management.  Echocardiogram 6/19 showed LVEF 123456, grade 1 diastolic dysfunction, and mild aortic/mitral regurgitation. Continue carvedilol 9.375 twice daily Continue losartan 50 mg daily Continue spironolactone 25 mg daily Continue furosemide 20 mg daily  Essential hypertension-BP 132/87.  She had not taken her morning BP medications. Continue carvedilol 9.375 mg twice daily Continue losartan 50 mg daily Heart healthy low-sodium diet-salty 6 given Increase physical activity as tolerated  Hyperlipidemia-11/03/2018: Cholesterol, Total 193; HDL 48; LDL Calculated 124; Triglycerides 106 she wishes to work on her diet and exercise prior to next lipid. Heart healthy low-sodium high-fiber diet Increase physical activity as tolerated Repeat lipid panel during follow-up visit.  Disposition: Follow-up Dr. Gwenlyn Found 6 month.   Jossie Ng. [facility] [place] [address] Office [phone] Fax [phone]

## 2019-07-07 ENCOUNTER — Other Ambulatory Visit: Payer: Self-pay

## 2019-07-07 ENCOUNTER — Encounter: Payer: Self-pay | Admitting: General Practice

## 2019-07-07 ENCOUNTER — Ambulatory Visit (INDEPENDENT_AMBULATORY_CARE_PROVIDER_SITE_OTHER): Payer: Medicare HMO | Admitting: General Practice

## 2019-07-07 VITALS — BP 132/87 | HR 69 | Temp 97.6°F | Wt 235.2 lb

## 2019-07-07 DIAGNOSIS — I5022 Chronic systolic (congestive) heart failure: Secondary | ICD-10-CM

## 2019-07-07 DIAGNOSIS — I10 Essential (primary) hypertension: Secondary | ICD-10-CM | POA: Diagnosis not present

## 2019-07-07 DIAGNOSIS — E876 Hypokalemia: Secondary | ICD-10-CM | POA: Diagnosis not present

## 2019-07-07 DIAGNOSIS — E7800 Pure hypercholesterolemia, unspecified: Secondary | ICD-10-CM

## 2019-07-07 DIAGNOSIS — R0789 Other chest pain: Secondary | ICD-10-CM | POA: Diagnosis not present

## 2019-07-07 MED ORDER — LOSARTAN POTASSIUM 50 MG PO TABS: 50.0000 mg | ORAL_TABLET | Freq: Every day | ORAL | 3 refills | Status: DC

## 2019-07-07 MED ORDER — CARVEDILOL 6.25 MG PO TABS: ORAL_TABLET | ORAL | 6 refills | Status: DC

## 2019-07-07 MED ORDER — SPIRONOLACTONE 25 MG PO TABS: 25.0000 mg | ORAL_TABLET | Freq: Every day | ORAL | 1 refills | Status: DC

## 2019-07-07 NOTE — Patient Instructions (Signed)
Special Instructions: PLEASE FOLLOW LOW SALT AND HIGH FIBER DIET-ATTACHED  PLEASE READ AND FOLLOW SALTY 6 ATTACHED  Reduce your risk of getting COVID-19 With your heart disease it is especially important for people at increased risk of severe illness from COVID-19, and those who live with them, to protect themselves from getting COVID-19. The best way to protect yourself and to help reduce the spread of the virus that causes COVID-19 is to: Marland Kitchen Limit your interactions with other people as much as possible. . Take precautions to prevent getting COVID-19 when you do interact with others. If you start feeling sick and think you may have COVID-19, get in touch with your healthcare provider within 24 hours.  Follow-Up: IN 6 months Please call our office 2 months in advance, APR 2021 to schedule this JUL 2021 appointment. Either In Person or Virtual You may see Quay Burow, MD or one of the following Advanced Practice Providers on your designated Care Team:  Coletta Memos, [place], PA-C [place], [place].    At [facility], you and your health needs are our priority.  As part of our continuing mission to provide you with exceptional heart care, we have created designated Provider Care Teams.  These Care Teams include your primary Cardiologist (physician) and Advanced Practice Providers (APPs -  Physician Assistants and Nurse Practitioners) who all work together to provide you with the care you need, when you need it.  Thank you for choosing [facility] at [facility]!!        Low-Sodium Eating Plan Sodium, which is an element that makes up salt, helps you maintain a healthy balance of fluids in your body. Too much sodium can increase your blood pressure and cause fluid and waste to be held in your body. Your health care provider or dietitian may recommend following this plan if you have high blood pressure (hypertension), kidney disease, liver disease, or heart failure. Eating  less sodium can help lower your blood pressure, reduce swelling, and protect your heart, liver, and kidneys. What are tips for following this plan? General guidelines  Most people on this plan should limit their sodium intake to 1,500-2,000 mg (milligrams) of sodium each day. Reading food labels   The Nutrition Facts label lists the amount of sodium in one serving of the food. If you eat more than one serving, you must multiply the listed amount of sodium by the number of servings.  Choose foods with less than 140 mg of sodium per serving.  Avoid foods with 300 mg of sodium or more per serving. Shopping  Look for lower-sodium products, often labeled as "low-sodium" or "no salt added."  Always check the sodium content even if foods are labeled as "unsalted" or "no salt added".  Buy fresh foods. ? Avoid canned foods and premade or frozen meals. ? Avoid canned, cured, or processed meats  Buy breads that have less than 80 mg of sodium per slice. Cooking  Eat more home-cooked food and less restaurant, buffet, and fast food.  Avoid adding salt when cooking. Use salt-free seasonings or herbs instead of table salt or sea salt. Check with your health care provider or pharmacist before using salt substitutes.  Cook with plant-based oils, such as canola, sunflower, or olive oil. Meal planning  When eating at a restaurant, ask that your food be prepared with less salt or no salt, if possible.  Avoid foods that contain MSG (monosodium glutamate). MSG is sometimes added to [place] food, bouillon, and some  canned foods. What foods are recommended? The items listed may not be a complete list. Talk with your dietitian about what dietary choices are best for you. Grains Low-sodium cereals, including oats, puffed wheat and rice, and shredded wheat. Low-sodium crackers. Unsalted rice. Unsalted pasta. Low-sodium bread. Whole-grain breads and whole-grain pasta. Vegetables Fresh or frozen  vegetables. "No salt added" canned vegetables. "No salt added" tomato sauce and paste. Low-sodium or reduced-sodium tomato and vegetable juice. Fruits Fresh, frozen, or canned fruit. Fruit juice. Meats and other protein foods Fresh or frozen (no salt added) meat, poultry, seafood, and fish. Low-sodium canned tuna and salmon. Unsalted nuts. Dried peas, beans, and lentils without added salt. Unsalted canned beans. Eggs. Unsalted nut butters. Dairy Milk. Soy milk. Cheese that is naturally low in sodium, such as ricotta cheese, fresh mozzarella, or Swiss cheese Low-sodium or reduced-sodium cheese. Cream cheese. Yogurt. Fats and oils Unsalted butter. Unsalted margarine with no trans fat. Vegetable oils such as canola or olive oils. Seasonings and other foods Fresh and dried herbs and spices. Salt-free seasonings. Low-sodium mustard and ketchup. Sodium-free salad dressing. Sodium-free light mayonnaise. Fresh or refrigerated horseradish. Lemon juice. Vinegar. Homemade, reduced-sodium, or low-sodium soups. Unsalted popcorn and pretzels. Low-salt or salt-free chips. What foods are not recommended? The items listed may not be a complete list. Talk with your dietitian about what dietary choices are best for you. Grains Instant hot cereals. Bread stuffing, pancake, and biscuit mixes. Croutons. Seasoned rice or pasta mixes. Noodle soup cups. Boxed or frozen macaroni and cheese. Regular salted crackers. Self-rising flour. Vegetables Sauerkraut, pickled vegetables, and relishes. Olives. [place] fries. Onion rings. Regular canned vegetables (not low-sodium or reduced-sodium). Regular canned tomato sauce and paste (not low-sodium or reduced-sodium). Regular tomato and vegetable juice (not low-sodium or reduced-sodium). Frozen vegetables in sauces. Meats and other protein foods Meat or fish that is salted, canned, smoked, spiced, or pickled. Bacon, ham, sausage, hotdogs, corned beef, chipped beef, packaged lunch  meats, salt pork, jerky, pickled herring, anchovies, regular canned tuna, sardines, salted nuts. Dairy Processed cheese and cheese spreads. Cheese curds. Blue cheese. Feta cheese. String cheese. Regular cottage cheese. Buttermilk. Canned milk. Fats and oils Salted butter. Regular margarine. Ghee. Bacon fat. Seasonings and other foods Onion salt, garlic salt, seasoned salt, table salt, and sea salt. Canned and packaged gravies. Worcestershire sauce. Tartar sauce. Barbecue sauce. Teriyaki sauce. Soy sauce, including reduced-sodium. Steak sauce. Fish sauce. Oyster sauce. Cocktail sauce. Horseradish that you find on the shelf. Regular ketchup and mustard. Meat flavorings and tenderizers. Bouillon cubes. Hot sauce and Tabasco sauce. Premade or packaged marinades. Premade or packaged taco seasonings. Relishes. Regular salad dressings. Salsa. Potato and tortilla chips. Corn chips and puffs. Salted popcorn and pretzels. Canned or dried soups. Pizza. Frozen entrees and pot pies. Summary  Eating less sodium can help lower your blood pressure, reduce swelling, and protect your heart, liver, and kidneys.  Most people on this plan should limit their sodium intake to 1,500-2,000 mg (milligrams) of sodium each day.  Canned, boxed, and frozen foods are high in sodium. Restaurant foods, fast foods, and pizza are also very high in sodium. You also get sodium by adding salt to food.  Try to cook at home, eat more fresh fruits and vegetables, and eat less fast food, canned, processed, or prepared foods. This information is not intended to replace advice given to you by your health care provider. Make sure you discuss any questions you have with your health care provider. Document Revised: 05/29/2017 Document  Reviewed: 06/09/2016 Elsevier Patient Education  [place].   High-Fiber Diet Fiber, also called dietary fiber, is a type of carbohydrate that is found in fruits, vegetables, whole grains, and beans.  A high-fiber diet can have many health benefits. Your health care provider may recommend a high-fiber diet to help:  Prevent constipation. Fiber can make your bowel movements more regular.  Lower your cholesterol.  Relieve the following conditions: ? Swelling of veins in the anus (hemorrhoids). ? Swelling and irritation (inflammation) of specific areas of the digestive tract (uncomplicated diverticulosis). ? A problem of the large intestine (colon) that sometimes causes pain and diarrhea (irritable bowel syndrome, IBS).  Prevent overeating as part of a weight-loss plan.  Prevent heart disease, type 2 diabetes, and certain cancers. What is my plan? The recommended daily fiber intake in grams (g) includes:  38 g for men age 28 or younger.  30 g for men over age 19.  1 g for women age 45 or younger.  21 g for women over age 64. You can get the recommended daily intake of dietary fiber by:  Eating a variety of fruits, vegetables, grains, and beans.  Taking a fiber supplement, if it is not possible to get enough fiber through your diet. What do I need to know about a high-fiber diet?  It is better to get fiber through food sources rather than from fiber supplements. There is not a lot of research about how effective supplements are.  Always check the fiber content on the nutrition facts label of any prepackaged food. Look for foods that contain 5 g of fiber or more per serving.  Talk with a diet and nutrition specialist (dietitian) if you have questions about specific foods that are recommended or not recommended for your medical condition, especially if those foods are not listed below.  Gradually increase how much fiber you consume. If you increase your intake of dietary fiber too quickly, you may have bloating, cramping, or gas.  Drink plenty of water. Water helps you to digest fiber. What are tips for following this plan?  Eat a wide variety of high-fiber foods.  Make sure  that half of the grains that you eat each day are whole grains.  Eat breads and cereals that are made with whole-grain flour instead of refined flour or white flour.  Eat brown rice, bulgur wheat, or millet instead of white rice.  Start the day with a breakfast that is high in fiber, such as a cereal that contains 5 g of fiber or more per serving.  Use beans in place of meat in soups, salads, and pasta dishes.  Eat high-fiber snacks, such as berries, raw vegetables, nuts, and popcorn.  Choose whole fruits and vegetables instead of processed forms like juice or sauce. What foods can I eat?  Fruits Berries. Pears. Apples. Oranges. Avocado. Prunes and raisins. Dried figs. Vegetables Sweet potatoes. Spinach. Kale. Artichokes. Cabbage. Broccoli. Cauliflower. Green peas. Carrots. Squash. Grains Whole-grain breads. Multigrain cereal. Oats and oatmeal. Brown rice. Barley. Bulgur wheat. [place]. Quinoa. Bran muffins. Popcorn. Rye wafer crackers. Meats and other proteins [place], kidney, and pinto beans. Soybeans. Split peas. Lentils. Nuts and seeds. Dairy Fiber-fortified yogurt. Beverages Fiber-fortified soy milk. Fiber-fortified orange juice. Other foods Fiber bars. The items listed above may not be a complete list of recommended foods and beverages. Contact a dietitian for more options. What foods are not recommended? Fruits Fruit juice. Cooked, strained fruit. Vegetables Fried potatoes. Canned vegetables. Well-cooked vegetables. Grains  White bread. Pasta made with refined flour. White rice. Meats and other proteins Fatty cuts of meat. Fried chicken or fried fish. Dairy Milk. Yogurt. Cream cheese. Sour cream. Fats and oils Butters. Beverages Soft drinks. Other foods Cakes and pastries. The items listed above may not be a complete list of foods and beverages to avoid. Contact a dietitian for more information. Summary  Fiber is a type of carbohydrate. It is found in fruits,  vegetables, whole grains, and beans.  There are many health benefits of eating a high-fiber diet, such as preventing constipation, lowering blood cholesterol, helping with weight loss, and reducing your risk of heart disease, diabetes, and certain cancers.  Gradually increase your intake of fiber. Increasing too fast can result in cramping, bloating, and gas. Drink plenty of water while you increase your fiber.  The best sources of fiber include whole fruits and vegetables, whole grains, nuts, seeds, and beans. This information is not intended to replace advice given to you by your health care provider. Make sure you discuss any questions you have with your health care provider. Document Revised: 04/20/2017 Document Reviewed: 04/20/2017 Elsevier Patient Education  2020 [employer].

## 2019-07-07 NOTE — Addendum Note (Signed)
Addended by: Waylan Rocher on: 07/07/2019 12:53 PM   Modules accepted: Orders

## 2019-07-08 NOTE — Telephone Encounter (Signed)
Sent to NP 

## 2019-07-21 ENCOUNTER — Other Ambulatory Visit: Payer: Self-pay | Admitting: Family Medicine

## 2019-07-21 DIAGNOSIS — E039 Hypothyroidism, unspecified: Secondary | ICD-10-CM

## 2019-08-10 ENCOUNTER — Ambulatory Visit: Payer: Medicare HMO | Admitting: Family Medicine

## 2019-09-04 ENCOUNTER — Other Ambulatory Visit: Payer: Self-pay | Admitting: Family Medicine

## 2019-09-04 DIAGNOSIS — E039 Hypothyroidism, unspecified: Secondary | ICD-10-CM

## 2019-09-14 ENCOUNTER — Other Ambulatory Visit: Payer: Self-pay | Admitting: Family Medicine

## 2019-09-14 NOTE — Telephone Encounter (Signed)
Refill request. I was unsure if this was long term since last fill had no refills. Please advise if this is ok to refill? Thanks!

## 2019-10-17 ENCOUNTER — Other Ambulatory Visit: Payer: Self-pay

## 2019-10-17 DIAGNOSIS — E039 Hypothyroidism, unspecified: Secondary | ICD-10-CM

## 2019-10-17 MED ORDER — THYROID 90 MG PO TABS: 90.0000 mg | ORAL_TABLET | Freq: Every day | ORAL | 0 refills | Status: DC

## 2019-10-21 ENCOUNTER — Other Ambulatory Visit: Payer: Self-pay

## 2019-10-21 DIAGNOSIS — E039 Hypothyroidism, unspecified: Secondary | ICD-10-CM

## 2019-11-21 ENCOUNTER — Ambulatory Visit: Payer: Medicare HMO | Admitting: Family Medicine

## 2019-12-07 ENCOUNTER — Ambulatory Visit: Payer: Medicare HMO | Admitting: Family Medicine

## 2019-12-14 ENCOUNTER — Ambulatory Visit (INDEPENDENT_AMBULATORY_CARE_PROVIDER_SITE_OTHER): Payer: Medicare HMO | Admitting: Family Medicine

## 2019-12-14 ENCOUNTER — Other Ambulatory Visit: Payer: Self-pay

## 2019-12-14 VITALS — BP 119/78 | HR 71 | Temp 97.1°F | Wt 241.0 lb

## 2019-12-14 DIAGNOSIS — Z09 Encounter for follow-up examination after completed treatment for conditions other than malignant neoplasm: Secondary | ICD-10-CM | POA: Diagnosis not present

## 2019-12-14 DIAGNOSIS — I10 Essential (primary) hypertension: Secondary | ICD-10-CM | POA: Diagnosis not present

## 2019-12-14 DIAGNOSIS — K219 Gastro-esophageal reflux disease without esophagitis: Secondary | ICD-10-CM | POA: Diagnosis not present

## 2019-12-14 DIAGNOSIS — E039 Hypothyroidism, unspecified: Secondary | ICD-10-CM

## 2019-12-14 LAB — POCT URINALYSIS DIPSTICK
Bilirubin, UA: NEGATIVE
Blood, UA: NEGATIVE
Glucose, UA: NEGATIVE
Ketones, UA: NEGATIVE
Leukocytes, UA: NEGATIVE
Nitrite, UA: NEGATIVE
Protein, UA: NEGATIVE
Spec Grav, UA: 1.02 (ref 1.010–1.025)
Urobilinogen, UA: 0.2 E.U./dL
pH, UA: 6 (ref 5.0–8.0)

## 2019-12-14 MED ORDER — THYROID 90 MG PO TABS: 90.0000 mg | ORAL_TABLET | Freq: Every day | ORAL | 11 refills | Status: DC

## 2019-12-14 MED ORDER — FUROSEMIDE 40 MG PO TABS: ORAL_TABLET | ORAL | 11 refills | Status: DC

## 2019-12-14 MED ORDER — OMEPRAZOLE 20 MG PO CPDR: 20.0000 mg | DELAYED_RELEASE_CAPSULE | Freq: Two times a day (BID) | ORAL | 11 refills | Status: DC

## 2019-12-14 NOTE — Progress Notes (Signed)
Patient [facility]   Established Patient Office Visit  Subjective:  Patient ID: Julia [name], female    DOB: Nov 20, 1955  Age: 64 y.o. MRN: [id number]  CC:  Chief Complaint  Patient presents with  . Follow-up    6 month follow up; medication refills    HPI Julia [name] is a 64 year old female who presents for Follow Up today.   Patient Active Problem List   Diagnosis Date Noted  . Hypothyroidism 02/08/2019  . Vaginal discharge 11/30/2018  . Hyperthyroidism 11/30/2018  . Pain due to dental caries 11/30/2018  . Gastroesophageal reflux disease without esophagitis 11/30/2018  . Hypertension 11/03/2018  . Shortness of breath 11/03/2018  . History of congestive cardiomyopathy 02/12/2014  . Hyperkalemia 02/12/2014  . Normal coronary arteries 02/12/2014  . Mitral regurgitation   . Essential hypertension, malignant 02/08/2014  . LBBB (left bundle branch block) 02/08/2014  . UTERINE FIBROID 08/27/2006     Past Medical History:  Diagnosis Date  . Allergy   . Anxiety   . CHF (congestive heart failure) ([place])   . GERD (gastroesophageal reflux disease)   . Hypertension   . Hypothyroidism 11/2018  . Mitral regurgitation    a. severe by ECHO  01/2014  . Systolic CHF ([place])    a. 2D ECHO: 02/09/2014; EF 35-30%, mild LV dilation, severe, diffuse hypokinesis, regional WMAs cannot be excluded. Ventricular septum with mild dyssynergy c/w LBBB, mild AR, severe MR, severely dilated LA.  . Tobacco abuse   . Vitamin D deficiency    Current Status: Since her last office visit, she is doing well with no complaints. She denies visual changes, chest pain, cough, shortness of breath, heart palpitations, and falls. She has occasional headaches and dizziness with position changes. Denies severe headaches, confusion, seizures, double vision, and blurred vision, nausea and vomiting. She denies fevers, chills, fatigue, recent infections, weight loss, and  night sweats. Denies GI problems such as diarrhea, and constipation. She has no reports of blood in stools, dysuria and hematuria. No depression or anxiety reported today. She denies suicidal ideations, homicidal ideations, or auditory hallucinations. She is taking all medications as prescribed. She denies pain today.   Past Surgical History:  Procedure Laterality Date  . ABDOMINAL HYSTERECTOMY     partial  . CARDIAC CATHETERIZATION  01/2014   Normal coronaries. EF of 35 to 40%  . DIAGNOSTIC LAPAROSCOPY     to remove fibrioid tumor  . LEFT HEART CATHETERIZATION WITH CORONARY ANGIOGRAM N/A 02/10/2014   Procedure: LEFT HEART CATHETERIZATION WITH CORONARY ANGIOGRAM;  Surgeon: Sinclair Grooms, MD;  Location: [facility] C[facility] LAB;  Service: Cardiovascular;  Laterality: N/A;  . MOUTH SURGERY N/A 2017  . TUBAL LIGATION      Family History  Problem Relation Age of Onset  . Diabetes Mother   . Hypertension Mother   . Diabetes Father   . Heart disease Father   . Diabetes Sister   . Drug abuse Sister   . Hypertension Sister   . Mental illness Sister   . Stroke Sister   . Miscarriages / Stillbirths Sister   . Alcohol abuse Brother   . Cancer Brother   . Alcohol abuse Maternal Aunt   . Alcohol abuse Maternal Uncle   . Alcohol abuse Paternal Uncle   . Cancer Paternal Uncle   . Colon cancer Neg Hx   . Colon polyps Neg Hx     Social History   Socioeconomic  History  . Marital status: Single    Spouse name: Not on file  . Number of children: Not on file  . Years of education: Not on file  . Highest education level: Not on file  Occupational History  . Not on file  Tobacco Use  . Smoking status: Former Smoker    Packs/day: 0.50    Types: Cigarettes    Quit date: 02/15/2014    Years since quitting: 5.8  . Smokeless tobacco: Never Used  Vaping Use  . Vaping Use: Never used  Substance and Sexual Activity  . Alcohol use: No    Alcohol/week: 0.0 standard drinks  . Drug use: No  . Sexual  activity: Yes  Other Topics Concern  . Not on file  Social History Narrative  . Not on file   Social Determinants of Health   Financial Resource Strain:   . Difficulty of Paying Living Expenses:   Food Insecurity:   . Worried About Charity fundraiser in the Last Year:   . Arboriculturist in the Last Year:   Transportation Needs:   . Film/video editor (Medical):   Marland Kitchen Lack of Transportation (Non-Medical):   Physical Activity:   . Days of Exercise per Week:   . Minutes of Exercise per Session:   Stress:   . Feeling of Stress :   Social Connections:   . Frequency of Communication with Friends and Family:   . Frequency of Social Gatherings with Friends and Family:   . Attends Religious Services:   . Active Member of Clubs or Organizations:   . Attends Archivist Meetings:   Marland Kitchen Marital Status:   Intimate Partner Violence:   . Fear of Current or Ex-Partner:   . Emotionally Abused:   Marland Kitchen Physically Abused:   . Sexually Abused:     Outpatient Medications Prior to Visit  Medication Sig Dispense Refill  . carvedilol (COREG) 6.25 MG tablet TAKE 1 & 1/2 (ONE & ONE-HALF) TABLETS BY MOUTH TWICE DAILY WITH MEALS 90 tablet 6  . ipratropium (ATROVENT) 0.03 % nasal spray Place 2 sprays into both nostrils 2 (two) times daily. 30 mL 0  . losartan (COZAAR) 50 MG tablet Take 1 tablet (50 mg total) by mouth daily. 90 tablet 3  . spironolactone (ALDACTONE) 25 MG tablet Take 1 tablet (25 mg total) by mouth daily. 90 tablet 1  . Vitamin D, Ergocalciferol, (DRISDOL) 1.25 MG (50000 UT) CAPS capsule Take 1 capsule (50,000 Units total) by mouth every 7 (seven) days. 5 capsule 6  . furosemide (LASIX) 40 MG tablet Take 1/2 (one-half) tablet by mouth once daily 45 tablet 0  . omeprazole (PRILOSEC) 20 MG capsule Take 1 capsule (20 mg total) by mouth 2 (two) times daily before a meal. As needed. 60 capsule 6  . thyroid (NP THYROID) 90 MG tablet Take 1 tablet (90 mg total) by mouth daily. 30  tablet 0   Facility-Administered Medications Prior to Visit  Medication Dose Route Frequency Provider Last Rate Last Admin  . 0.9 %  sodium chloride infusion  500 mL Intravenous Continuous Nandigam, Venia Minks, MD        Allergies  Allergen Reactions  . Synthroid [Levothyroxine Sodium]     Lip swelling and itching.     ROS Review of Systems  Constitutional: Negative.   HENT: Negative.   Eyes: Negative.   Respiratory: Negative.   Cardiovascular: Negative.   Gastrointestinal: Positive for abdominal distention.  Endocrine: Negative.  Genitourinary: Negative.   Musculoskeletal: Positive for arthralgias (generalized joint pain).  Skin: Negative.   Allergic/Immunologic: Negative.   Neurological: Positive for dizziness (occasional ) and headaches (occasional ).  Hematological: Negative.   Psychiatric/Behavioral: Negative.       Objective:    Physical Exam Vitals and nursing note reviewed.  Constitutional:      Appearance: Normal appearance.  HENT:     Head: Normocephalic and atraumatic.     Nose: Nose normal.     Mouth/Throat:     Mouth: Mucous membranes are moist.  Cardiovascular:     Rate and Rhythm: Normal rate and regular rhythm.     Pulses: Normal pulses.     Heart sounds: Normal heart sounds.  Pulmonary:     Effort: Pulmonary effort is normal.     Breath sounds: Normal breath sounds.  Abdominal:     General: Bowel sounds are normal. There is distension (obese).     Palpations: Abdomen is soft.  Musculoskeletal:        General: Normal range of motion.     Cervical back: Normal range of motion and neck supple.  Skin:    General: Skin is warm and dry.  Neurological:     General: No focal deficit present.     Mental Status: She is alert and oriented to person, place, and time.  Psychiatric:        Mood and Affect: Mood normal.        Behavior: Behavior normal.        Thought Content: Thought content normal.        Judgment: Judgment normal.    BP 119/78  (BP Location: Right Arm, Patient Position: Sitting, Cuff Size: Large)   Pulse 71   Temp (!) 97.1 F (36.2 C)   Wt 241 lb 0.2 oz (109.3 kg)   SpO2 93%   BMI 38.90 kg/m  Wt Readings from Last 3 Encounters:  12/14/19 241 lb 0.2 oz (109.3 kg)  07/07/19 235 lb 3.2 oz (106.7 kg)  05/24/19 265 lb (120.2 kg)    Health Maintenance Due  Topic Date Due  . COVID-19 Vaccine (1) Never done  . PAP SMEAR-Modifier  Never done    There are no preventive care reminders to display for this patient.  Lab Results  Component Value Date   TSH 0.009 (L) 11/29/2018   Lab Results  Component Value Date   WBC 5.7 06/29/2019   HGB 13.9 06/29/2019   HCT 43.9 06/29/2019   MCV 96.7 06/29/2019   PLT 218 06/29/2019   Lab Results  Component Value Date   NA 140 06/29/2019   K 4.5 06/29/2019   CO2 26 06/29/2019   GLUCOSE 116 (H) 06/29/2019   BUN 11 06/29/2019   CREATININE 0.72 06/29/2019   BILITOT 0.5 06/29/2019   ALKPHOS 60 06/29/2019   AST 20 06/29/2019   ALT 18 06/29/2019   PROT 6.7 06/29/2019   ALBUMIN 3.5 06/29/2019   CALCIUM 9.2 06/29/2019   ANIONGAP 7 06/29/2019   GFR 97.38 03/28/2014   Lab Results  Component Value Date   CHOL 193 11/03/2018   Lab Results  Component Value Date   HDL 48 11/03/2018   Lab Results  Component Value Date   LDLCALC 124 (H) 11/03/2018   Lab Results  Component Value Date   TRIG 106 11/03/2018   Lab Results  Component Value Date   CHOLHDL 4.0 11/03/2018   Lab Results  Component Value Date   HGBA1C 5.7 (  A) 11/03/2018      Assessment & Plan:   1. Hypertension, unspecified type The current medical regimen is effective; blood pressure is stable at 119/78 today; continue present plan and medications as prescribed. She will continue to take medications as prescribed, to decrease high sodium intake, excessive alcohol intake, increase potassium intake, smoking cessation, and increase physical activity of at least 30 minutes of cardio activity daily.  She will continue to follow Heart Healthy or DASH diet. - Urinalysis Dipstick  2. Gastroesophageal reflux disease without esophagitis - omeprazole (PRILOSEC) 20 MG capsule; Take 1 capsule (20 mg total) by mouth 2 (two) times daily before a meal. As needed.  Dispense: 60 capsule; Refill: 11  3. Hypothyroidism, unspecified type - thyroid (NP THYROID) 90 MG tablet; Take 1 tablet (90 mg total) by mouth daily.  Dispense: 30 tablet; Refill: 11  4. Essential hypertension, malignant - furosemide (LASIX) 40 MG tablet; Take 1/2 (one-half) tablet by mouth once daily  Dispense: 45 tablet; Refill: 11  5. Follow up She will follow up in 3 months.   Meds ordered this encounter  Medications  . furosemide (LASIX) 40 MG tablet    Sig: Take 1/2 (one-half) tablet by mouth once daily    Dispense:  45 tablet    Refill:  11  . omeprazole (PRILOSEC) 20 MG capsule    Sig: Take 1 capsule (20 mg total) by mouth 2 (two) times daily before a meal. As needed.    Dispense:  60 capsule    Refill:  11  . thyroid (NP THYROID) 90 MG tablet    Sig: Take 1 tablet (90 mg total) by mouth daily.    Dispense:  30 tablet    Refill:  11    Orders Placed This Encounter  Procedures  . Urinalysis Dipstick    Referral Orders  No referral(s) requested today    [name],  MSN, FNP-BC [address] [place], [place] [address] [phone] fax  Problem List Items Addressed This Visit      Cardiovascular and Mediastinum   Essential hypertension, malignant   Relevant Medications   furosemide (LASIX) 40 MG tablet   Hypertension - Primary   Relevant Medications   furosemide (LASIX) 40 MG tablet   Other Relevant Orders   Urinalysis Dipstick (Completed)     Digestive   Gastroesophageal reflux disease without esophagitis   Relevant Medications   omeprazole (PRILOSEC) 20 MG capsule     Endocrine   Hypothyroidism    Relevant Medications   thyroid (NP THYROID) 90 MG tablet    Other Visit Diagnoses    Follow up          Meds ordered this encounter  Medications  . furosemide (LASIX) 40 MG tablet    Sig: Take 1/2 (one-half) tablet by mouth once daily    Dispense:  45 tablet    Refill:  11  . omeprazole (PRILOSEC) 20 MG capsule    Sig: Take 1 capsule (20 mg total) by mouth 2 (two) times daily before a meal. As needed.    Dispense:  60 capsule    Refill:  11  . thyroid (NP THYROID) 90 MG tablet    Sig: Take 1 tablet (90 mg total) by mouth daily.    Dispense:  30 tablet    Refill:  11    Follow-up: Return in about 3 months (around 03/15/2020).    Lanelle Bal  Jennet Maduro, FNP

## 2020-01-03 ENCOUNTER — Encounter: Payer: Self-pay | Admitting: Cardiovascular Disease

## 2020-01-03 ENCOUNTER — Ambulatory Visit (INDEPENDENT_AMBULATORY_CARE_PROVIDER_SITE_OTHER): Payer: Medicare HMO | Admitting: Cardiovascular Disease

## 2020-01-03 ENCOUNTER — Other Ambulatory Visit: Payer: Self-pay

## 2020-01-03 VITALS — BP 142/86 | HR 66 | Ht 66.5 in | Wt 239.0 lb

## 2020-01-03 DIAGNOSIS — Z5181 Encounter for therapeutic drug level monitoring: Secondary | ICD-10-CM

## 2020-01-03 DIAGNOSIS — I447 Left bundle-branch block, unspecified: Secondary | ICD-10-CM

## 2020-01-03 DIAGNOSIS — E7800 Pure hypercholesterolemia, unspecified: Secondary | ICD-10-CM

## 2020-01-03 LAB — LIPID PANEL
Chol/HDL Ratio: 4 ratio (ref 0.0–4.4)
Cholesterol, Total: 171 mg/dL (ref 100–199)
HDL: 43 mg/dL (ref >39)
LDL Chol Calc (NIH): 111 mg/dL — ABNORMAL HIGH (ref 0–99)
Triglycerides: 90 mg/dL (ref 0–149)
VLDL Cholesterol Cal: 17 mg/dL (ref 5–40)

## 2020-01-03 LAB — HEPATIC FUNCTION PANEL
ALT: 13 IU/L (ref 0–32)
AST: 14 IU/L (ref 0–40)
Albumin: 4.1 g/dL (ref 3.8–4.8)
Alkaline Phosphatase: 75 IU/L (ref 48–121)
Bilirubin Total: 0.5 mg/dL (ref 0.0–1.2)
Bilirubin, Direct: 0.14 mg/dL (ref 0.00–0.40)
Total Protein: 6.7 g/dL (ref 6.0–8.5)

## 2020-01-03 NOTE — Assessment & Plan Note (Signed)
History of essential hypertension a blood pressure measured today 142/86.  She is on carvedilol losartan and spironolactone which she has not yet taken today.

## 2020-01-03 NOTE — Patient Instructions (Signed)
Medication Instructions:  Your physician recommends that you continue on your current medications as directed. Please refer to the Current Medication list given to you today.  *If you need a refill on your cardiac medications before your next appointment, please call your pharmacy*  Lab Work: LIPID/HFP TODAY   If you have labs (blood work) drawn today and your tests are completely normal, you will receive your results only by: Marland Kitchen MyChart Message (if you have MyChart) OR . A paper copy in the mail If you have any lab test that is abnormal or we need to change your treatment, we will call you to review the results.  Testing/Procedures: NONE   Follow-Up: At [facility], you and your health needs are our priority.  As part of our continuing mission to provide you with exceptional heart care, we have created designated Provider Care Teams.  These Care Teams include your primary Cardiologist (physician) and Advanced Practice Providers (APPs -  Physician Assistants and Nurse Practitioners) who all work together to provide you with the care you need, when you need it.  We recommend signing up for the patient portal called "MyChart".  Sign up information is provided on this After Visit Summary.  MyChart is used to connect with patients for Virtual Visits (Telemedicine).  Patients are able to view lab/test results, encounter notes, upcoming appointments, etc.  Non-urgent messages can be sent to your provider as well.   To learn more about what you can do with MyChart, go to [email or web address].    Your next appointment:   12 month(s)  The format for your next appointment:   In Person  Provider:   You may see Quay Burow, MD or one of the following Advanced Practice Providers on your designated Care Team:    Kerin Ransom, PA-C  [place], [place]  Coletta Memos, [place]

## 2020-01-03 NOTE — Assessment & Plan Note (Signed)
Transient in the past, narrow complex QRS today.

## 2020-01-03 NOTE — Progress Notes (Signed)
[birth year]114 RODOLFO NOTARO   [birth year]  [id number]  Primary Physician Azzie Glatter, FNP Primary Cardiologist: Lorretta Harp MD FACP, [facility], [place], [place]  HPI:  Julia [name] is a 64 y.o. mild to moderately overweight single African-American female mother of 2 children, grandmother of 17 grandchildren and has worked as a Glass blower/designer in the past and currently is on disability because of her cardiomyopathy.I last spoke to her for a virtual telemedicine phone visit 01/21/2019. Her primary care provider is Dr. Liston Alba. She has seen Dr. Debara Pickett in the past. Her cardiovascular risk factors include 10-pack-years of tobacco having quit 3 years ago and treated hypertension. She's never had a heart attack or stroke. She denies chest pain but does get some dyspnea on exertion. She had a cardiac catheterization remotely that has shown normal coronary arteries and LV dysfunction and EF of 35% at that time which ultimately improved to 50% on medications. Since she was seen by Dr. Debara Pickett 06/18/15 she does complain of some dyspnea on exertion but denies chest pain. She is compliant with her medications and her diet. She gets occasional shortness of breath. Her most recent 2D echo performed 12/10/2017 was entirely normal.  Since I I spoke to her a year ago she was seen in the ER at the end of last year because of atypical musculoskeletal pain after moving heavy furniture.  This ultimately resolved with nonsteroidals.  Work-up was unrevealing and his pain resolved spontaneously.      Current Meds  Medication Sig  . carvedilol (COREG) 6.25 MG tablet TAKE 1 & 1/2 (ONE & ONE-HALF) TABLETS BY MOUTH TWICE DAILY WITH MEALS  . furosemide (LASIX) 40 MG tablet Take 1/2 (one-half) tablet by mouth once daily  . ipratropium (ATROVENT) 0.03 % nasal spray Place 2 sprays into both nostrils 2 (two) times daily.  Marland Kitchen losartan (COZAAR) 50 MG tablet Take 1 tablet (50 mg total) by mouth daily.  Marland Kitchen  omeprazole (PRILOSEC) 20 MG capsule Take 1 capsule (20 mg total) by mouth 2 (two) times daily before a meal. As needed.  Marland Kitchen spironolactone (ALDACTONE) 25 MG tablet Take 1 tablet (25 mg total) by mouth daily.  Marland Kitchen thyroid (NP THYROID) 90 MG tablet Take 1 tablet (90 mg total) by mouth daily.  . Vitamin D, Ergocalciferol, (DRISDOL) 1.25 MG (50000 UT) CAPS capsule Take 1 capsule (50,000 Units total) by mouth every 7 (seven) days.   Current Facility-Administered Medications for the 01/03/20 encounter (Office Visit) with Lorretta Harp, MD  Medication  . 0.9 %  sodium chloride infusion     Allergies  Allergen Reactions  . Synthroid [Levothyroxine Sodium]     Lip swelling and itching.     Social History   Socioeconomic History  . Marital status: Single    Spouse name: Not on file  . Number of children: Not on file  . Years of education: Not on file  . Highest education level: Not on file  Occupational History  . Not on file  Tobacco Use  . Smoking status: Former Smoker    Packs/day: 0.50    Types: Cigarettes    Quit date: 02/15/2014    Years since quitting: 5.8  . Smokeless tobacco: Never Used  Vaping Use  . Vaping Use: Never used  Substance and Sexual Activity  . Alcohol use: No    Alcohol/week: 0.0 standard drinks  . Drug use: No  . Sexual activity: Yes  Other Topics Concern  . Not  on file  Social History Narrative  . Not on file   Social Determinants of Health   Financial Resource Strain:   . Difficulty of Paying Living Expenses:   Food Insecurity:   . Worried About Charity fundraiser in the Last Year:   . Arboriculturist in the Last Year:   Transportation Needs:   . Film/video editor (Medical):   Marland Kitchen Lack of Transportation (Non-Medical):   Physical Activity:   . Days of Exercise per Week:   . Minutes of Exercise per Session:   Stress:   . Feeling of Stress :   Social Connections:   . Frequency of Communication with Friends and Family:   . Frequency of  Social Gatherings with Friends and Family:   . Attends Religious Services:   . Active Member of Clubs or Organizations:   . Attends Archivist Meetings:   Marland Kitchen Marital Status:   Intimate Partner Violence:   . Fear of Current or Ex-Partner:   . Emotionally Abused:   Marland Kitchen Physically Abused:   . Sexually Abused:      Review of Systems: General: negative for chills, fever, night sweats or weight changes.  Cardiovascular: negative for chest pain, dyspnea on exertion, edema, orthopnea, palpitations, paroxysmal nocturnal dyspnea or shortness of breath Dermatological: negative for rash Respiratory: negative for cough or wheezing Urologic: negative for hematuria Abdominal: negative for nausea, vomiting, diarrhea, bright red blood per rectum, melena, or hematemesis Neurologic: negative for visual changes, syncope, or dizziness All other systems reviewed and are otherwise negative except as noted above.    Blood pressure (!) 142/86, pulse 66, height 5' 6.5" (1.689 m), weight 239 lb (108.4 kg), SpO2 97 %.  General appearance: alert and no distress Neck: no adenopathy, no carotid bruit, no JVD, supple, symmetrical, trachea midline and thyroid not enlarged, symmetric, no tenderness/mass/nodules Lungs: clear to auscultation bilaterally Heart: regular rate and rhythm, S1, S2 normal, no murmur, click, rub or gallop Extremities: extremities normal, atraumatic, no cyanosis or edema Pulses: 2+ and symmetric Skin: Skin color, texture, turgor normal. No rashes or lesions Neurologic: Alert and oriented X 3, normal strength and tone. Normal symmetric reflexes. Normal coordination and gait  EKG sinus rhythm at 66 with nonspecific ST and T wave changes.  I personally reviewed this EKG.  ASSESSMENT AND PLAN:   LBBB (left bundle branch block) Transient in the past, narrow complex QRS today.  Essential hypertension, malignant History of essential hypertension a blood pressure measured today 142/86.   She is on carvedilol losartan and spironolactone which she has not yet taken today.      Lorretta Harp MD FACP,FACC,FAHA, [facility] 01/03/2020 9:40 AM

## 2020-01-04 ENCOUNTER — Telehealth: Payer: Self-pay | Admitting: *Deleted

## 2020-01-04 DIAGNOSIS — E7800 Pure hypercholesterolemia, unspecified: Secondary | ICD-10-CM

## 2020-01-04 NOTE — Telephone Encounter (Signed)
-----   Message from Lorretta Harp, MD sent at 01/04/2020  7:11 AM EDT ----- LDL 111. Not at goal for primary prevention. Please send heart healthy diet and recheck in 3 months

## 2020-01-04 NOTE — Telephone Encounter (Signed)
The patient has been notified of the result and verbalized understanding.  All questions (if any) were answered. Mailed labslip and  Healthy diet Raiford Simmonds, RN 01/04/2020 9:10 AM

## 2020-01-09 ENCOUNTER — Other Ambulatory Visit: Payer: Self-pay | Admitting: Family Medicine

## 2020-01-09 DIAGNOSIS — E559 Vitamin D deficiency, unspecified: Secondary | ICD-10-CM

## 2020-02-03 ENCOUNTER — Other Ambulatory Visit: Payer: Self-pay | Admitting: Family Medicine

## 2020-02-03 DIAGNOSIS — Z1231 Encounter for screening mammogram for malignant neoplasm of breast: Secondary | ICD-10-CM

## 2020-02-06 DIAGNOSIS — R69 Illness, unspecified: Secondary | ICD-10-CM | POA: Diagnosis not present

## 2020-02-10 ENCOUNTER — Ambulatory Visit
Admission: RE | Admit: 2020-02-10 | Discharge: 2020-02-10 | Disposition: A | Discharge status: Home / Self Care | Payer: Medicare HMO | Source: Ambulatory Visit | Attending: Family Medicine | Admitting: Family Medicine

## 2020-02-10 DIAGNOSIS — Z1231 Encounter for screening mammogram for malignant neoplasm of breast: Secondary | ICD-10-CM

## 2020-03-15 ENCOUNTER — Other Ambulatory Visit: Payer: Medicare HMO

## 2020-04-13 ENCOUNTER — Ambulatory Visit: Payer: Medicare HMO | Admitting: Family Medicine

## 2020-04-16 ENCOUNTER — Other Ambulatory Visit: Payer: Medicare HMO

## 2020-04-18 ENCOUNTER — Other Ambulatory Visit: Payer: Self-pay | Admitting: General Practice

## 2020-04-18 DIAGNOSIS — I5022 Chronic systolic (congestive) heart failure: Secondary | ICD-10-CM

## 2020-07-10 ENCOUNTER — Telehealth: Payer: Medicare HMO | Admitting: Family Medicine

## 2020-07-11 ENCOUNTER — Telehealth: Payer: Self-pay | Admitting: Family Medicine

## 2020-07-11 NOTE — Telephone Encounter (Signed)
Spoke to patient today to assess since she missed appointment on 07/10/2020. She states that she is doing well, with no complaints. She does not need any refills or any new Rxs at this time. We will schedule her for follow up.

## 2020-07-13 ENCOUNTER — Telehealth: Payer: Medicare HMO | Admitting: Family Medicine

## 2020-07-20 ENCOUNTER — Ambulatory Visit: Payer: Medicare HMO | Admitting: Family Medicine

## 2020-07-27 ENCOUNTER — Other Ambulatory Visit: Payer: Self-pay | Admitting: General Practice

## 2020-07-27 DIAGNOSIS — I5022 Chronic systolic (congestive) heart failure: Secondary | ICD-10-CM

## 2020-07-27 DIAGNOSIS — E876 Hypokalemia: Secondary | ICD-10-CM

## 2020-07-27 DIAGNOSIS — I10 Essential (primary) hypertension: Secondary | ICD-10-CM

## 2020-08-03 ENCOUNTER — Ambulatory Visit: Payer: Medicare HMO | Admitting: Family Medicine

## 2020-08-13 ENCOUNTER — Other Ambulatory Visit: Payer: Self-pay | Admitting: Family Medicine

## 2020-08-13 ENCOUNTER — Encounter: Payer: Self-pay | Admitting: Family Medicine

## 2020-08-13 ENCOUNTER — Ambulatory Visit (INDEPENDENT_AMBULATORY_CARE_PROVIDER_SITE_OTHER): Payer: Medicare HMO | Admitting: Family Medicine

## 2020-08-13 ENCOUNTER — Other Ambulatory Visit: Payer: Self-pay

## 2020-08-13 VITALS — BP 109/62 | HR 75 | Temp 97.0°F | Ht 66.5 in | Wt 237.5 lb

## 2020-08-13 DIAGNOSIS — R0789 Other chest pain: Secondary | ICD-10-CM | POA: Diagnosis not present

## 2020-08-13 DIAGNOSIS — E039 Hypothyroidism, unspecified: Secondary | ICD-10-CM

## 2020-08-13 DIAGNOSIS — I10 Essential (primary) hypertension: Secondary | ICD-10-CM | POA: Diagnosis not present

## 2020-08-13 DIAGNOSIS — Z09 Encounter for follow-up examination after completed treatment for conditions other than malignant neoplasm: Secondary | ICD-10-CM | POA: Diagnosis not present

## 2020-08-13 DIAGNOSIS — Z0000 Encounter for general adult medical examination without abnormal findings: Secondary | ICD-10-CM | POA: Diagnosis not present

## 2020-08-13 DIAGNOSIS — E559 Vitamin D deficiency, unspecified: Secondary | ICD-10-CM

## 2020-08-13 MED ORDER — THYROID 90 MG PO TABS: 90.0000 mg | ORAL_TABLET | Freq: Every day | ORAL | 3 refills | Status: DC

## 2020-08-13 MED ORDER — CARVEDILOL 6.25 MG PO TABS: ORAL_TABLET | ORAL | 3 refills | Status: DC

## 2020-08-13 MED FILL — CARVEDILOL 6.25 MG TABLET: 6.25 | 90 days supply | Qty: 270 | Fill #0

## 2020-08-13 NOTE — Progress Notes (Signed)
Patient Julia [name]   Established Patient Office Visit  Subjective:  Patient ID: Julia [name], female    DOB: Oct 29, 1955  Age: 65 y.o. MRN: [id number]  CC:  Chief Complaint  Patient presents with  . Follow-up    Follow up htn     HPI [name] is a 65 year old female who presents for Follow Up today.  Patient Active Problem List   Diagnosis Date Noted  . Hypothyroidism 02/08/2019  . Vaginal discharge 11/30/2018  . Hyperthyroidism 11/30/2018  . Pain due to dental caries 11/30/2018  . Gastroesophageal reflux disease without esophagitis 11/30/2018  . Hypertension 11/03/2018  . Shortness of breath 11/03/2018  . History of congestive cardiomyopathy 02/12/2014  . Hyperkalemia 02/12/2014  . Normal coronary arteries 02/12/2014  . Mitral regurgitation   . Essential hypertension, malignant 02/08/2014  . LBBB (left bundle branch block) 02/08/2014  . UTERINE FIBROID 08/27/2006    Current Status: Since her last office visit, she is doing well with no complaints. She denies visual changes, chest pain, cough, shortness of breath, heart palpitations, and falls. She has occasional headaches and dizziness with position changes. Denies severe headaches, confusion, seizures, double vision, and blurred vision, nausea and vomiting. She denies fevers, chills, fatigue, recent infections, weight loss, and night sweats. Denies GI problems such as diarrhea, and constipation. She has no reports of blood in stools, dysuria and hematuria. No depression or anxiety reported today. She is taking all medications as prescribed. She denies pain today.   Past Medical History:  Diagnosis Date  . Allergy   . Anxiety   . CHF (congestive heart failure) ([place])   . GERD (gastroesophageal reflux disease)   . Hypertension   . Hypothyroidism 11/2018  . Mitral regurgitation    a. severe by ECHO  01/2014  . Systolic CHF ([place])    a. 2D ECHO: 02/09/2014; EF 35-30%, mild  LV dilation, severe, diffuse hypokinesis, regional WMAs cannot be excluded. Ventricular septum with mild dyssynergy c/w LBBB, mild AR, severe MR, severely dilated LA.  . Tobacco abuse   . Vitamin D deficiency     Past Surgical History:  Procedure Laterality Date  . ABDOMINAL HYSTERECTOMY     partial  . CARDIAC CATHETERIZATION  01/2014   Normal coronaries. EF of 35 to 40%  . DIAGNOSTIC LAPAROSCOPY     to remove fibrioid tumor  . LEFT HEART CATHETERIZATION WITH CORONARY ANGIOGRAM N/A 02/10/2014   Procedure: LEFT HEART CATHETERIZATION WITH CORONARY ANGIOGRAM;  Surgeon: Sinclair Grooms, MD;  [facility];  Service: Cardiovascular;  Laterality: N/A;  . MOUTH SURGERY N/A 2017  . TUBAL LIGATION      Family History  Problem Relation Age of Onset  . Diabetes Mother   . Hypertension Mother   . Diabetes Father   . Heart disease Father   . Diabetes Sister   . Drug abuse Sister   . Hypertension Sister   . Mental illness Sister   . Stroke Sister   . Miscarriages / Stillbirths Sister   . Alcohol abuse Brother   . Cancer Brother   . Alcohol abuse Maternal Aunt   . Alcohol abuse Maternal Uncle   . Alcohol abuse Paternal Uncle   . Cancer Paternal Uncle   . Colon cancer Neg Hx   . Colon polyps Neg Hx     Social History   Socioeconomic History  . Marital status: Single    Spouse name: Not on  file  . Number of children: Not on file  . Years of education: Not on file  . Highest education level: Not on file  Occupational History  . Not on file  Tobacco Use  . Smoking status: Former Smoker    Packs/day: 0.50    Types: Cigarettes    Quit date: 02/15/2014    Years since quitting: 6.4  . Smokeless tobacco: Never Used  Vaping Use  . Vaping Use: Never used  Substance and Sexual Activity  . Alcohol use: No    Alcohol/week: 0.0 standard drinks  . Drug use: No  . Sexual activity: Yes  Other Topics Concern  . Not on file  Social History Narrative  . Not on file   Social  Determinants of Health   Financial Resource Strain: Not on file  Food Insecurity: Not on file  Transportation Needs: Not on file  Physical Activity: Not on file  Stress: Not on file  Social Connections: Not on file  Intimate Partner Violence: Not on file    Outpatient Medications Prior to Visit  Medication Sig Dispense Refill  . furosemide (LASIX) 40 MG tablet Take 1/2 (one-half) tablet by mouth once daily 45 tablet 11  . ipratropium (ATROVENT) 0.03 % nasal spray Place 2 sprays into both nostrils 2 (two) times daily. 30 mL 0  . losartan (COZAAR) 50 MG tablet Take 1 tablet by mouth once daily 90 tablet 2  . omeprazole (PRILOSEC) 20 MG capsule Take 1 capsule (20 mg total) by mouth 2 (two) times daily before a meal. As needed. 60 capsule 11  . spironolactone (ALDACTONE) 25 MG tablet Take 1 tablet by mouth once daily 90 tablet 3  . Vitamin D, Ergocalciferol, (DRISDOL) 1.25 MG (50000 UNIT) CAPS capsule Take 1 capsule by mouth once a week 35 capsule 0  . carvedilol (COREG) 6.25 MG tablet TAKE 1 & 1/2 (ONE & ONE-HALF) TABLETS BY MOUTH TWICE DAILY WITH MEALS 90 tablet 6  . thyroid (NP THYROID) 90 MG tablet Take 1 tablet (90 mg total) by mouth daily. 30 tablet 11   Facility-Administered Medications Prior to Visit  Medication Dose Route Frequency Provider Last Rate Last Admin  . 0.9 %  sodium chloride infusion  500 mL Intravenous Continuous Nandigam, Venia Minks, MD        Allergies  Allergen Reactions  . Synthroid [Levothyroxine Sodium]     Lip swelling and itching.     ROS Review of Systems  Constitutional: Negative.   HENT: Negative.   Eyes: Negative.   Respiratory: Negative.   Cardiovascular: Negative.   Gastrointestinal: Positive for abdominal distention (obese).  Endocrine: Negative.   Genitourinary: Positive for vaginal discharge.  Musculoskeletal: Positive for arthralgias (generalized joint pain).  Skin: Negative.   Allergic/Immunologic: Negative.   Neurological: Positive  for dizziness (occasional) and headaches (occasional ).  Hematological: Negative.   Psychiatric/Behavioral: Negative.       Objective:    Physical Exam Vitals and nursing note reviewed.  Constitutional:      Appearance: Normal appearance.  HENT:     Head: Normocephalic and atraumatic.     Nose: Nose normal.     Mouth/Throat:     Mouth: Mucous membranes are moist.     Pharynx: Oropharynx is clear.  Cardiovascular:     Rate and Rhythm: Normal rate and regular rhythm.     Pulses: Normal pulses.     Heart sounds: Normal heart sounds.  Pulmonary:     Effort: Pulmonary effort is normal.  Breath sounds: Normal breath sounds.  Abdominal:     General: Bowel sounds are normal.     Palpations: Abdomen is soft.  Musculoskeletal:        General: Normal range of motion.     Cervical back: Normal range of motion and neck supple.  Skin:    General: Skin is warm and dry.  Neurological:     General: No focal deficit present.     Mental Status: She is alert and oriented to person, place, and time.  Psychiatric:        Mood and Affect: Mood normal.        Behavior: Behavior normal.        Thought Content: Thought content normal.        Judgment: Judgment normal.     BP 109/62 (BP Location: Left Arm, Patient Position: Sitting, Cuff Size: Large)   Pulse 75   Temp (!) 97 F (36.1 C) (Temporal)   Ht 5' 6.5" (1.689 m)   Wt 237 lb 8 oz (107.7 kg)   SpO2 95%   BMI 37.76 kg/m  Wt Readings from Last 3 Encounters:  08/13/20 237 lb 8 oz (107.7 kg)  01/03/20 239 lb (108.4 kg)  12/14/19 241 lb 0.2 oz (109.3 kg)     Health Maintenance Due  Topic Date Due  . COVID-19 Vaccine (1) Never done  . PAP SMEAR-Modifier  Never done  . INFLUENZA VACCINE  Never done    There are no preventive [name] reminders to display for this patient.  Lab Results  Component Value Date   TSH 0.009 (L) 11/29/2018   Lab Results  Component Value Date   WBC 5.7 06/29/2019   HGB 13.9 06/29/2019   HCT  43.9 06/29/2019   MCV 96.7 06/29/2019   PLT 218 06/29/2019   Lab Results  Component Value Date   NA 140 06/29/2019   K 4.5 06/29/2019   CO2 26 06/29/2019   GLUCOSE 116 (H) 06/29/2019   BUN 11 06/29/2019   CREATININE 0.72 06/29/2019   BILITOT 0.5 01/03/2020   ALKPHOS 75 01/03/2020   AST 14 01/03/2020   ALT 13 01/03/2020   PROT 6.7 01/03/2020   ALBUMIN 4.1 01/03/2020   CALCIUM 9.2 06/29/2019   ANIONGAP 7 06/29/2019   GFR 97.38 03/28/2014   Lab Results  Component Value Date   CHOL 171 01/03/2020   Lab Results  Component Value Date   HDL 43 01/03/2020   Lab Results  Component Value Date   LDLCALC 111 (H) 01/03/2020   Lab Results  Component Value Date   TRIG 90 01/03/2020   Lab Results  Component Value Date   CHOLHDL 4.0 01/03/2020   Lab Results  Component Value Date   HGBA1C 5.7 (A) 11/03/2018      Assessment & Plan:   1. Hypertension, unspecified type The current medical regimen is effective; blood pressue is stable at 109/62 today; continue present plan and medications as prescribed. She will continue to take medications as prescribed, to decrease high sodium intake, excessive alcohol intake, increase potassium intake, smoking cessation, and increase physical activity of at least 30 minutes of cardio activity daily. She will continue to follow Heart Healthy or DASH diet. - carvedilol (COREG) 6.25 MG tablet; TAKE 1 & 1/2 (ONE & ONE-HALF) TABLETS BY MOUTH TWICE DAILY WITH MEALS  Dispense: 270 tablet; Refill: 3  2. Atypical chest pain Stable. No signs or symptoms of respiratory distress noted or reported today.   3. Hypothyroidism,  unspecified type - thyroid (NP THYROID) 90 MG tablet; Take 1 tablet (90 mg total) by mouth daily.  Dispense: 90 tablet; Refill: 3  4. Vitamin D deficiency  5. Healthcare maintenance  6. Follow up She will follow up in 10/2020 for Annual Physical and Labwork.   Meds ordered this encounter  Medications  . thyroid (NP THYROID) 90  MG tablet    Sig: Take 1 tablet (90 mg total) by mouth daily.    Dispense:  90 tablet    Refill:  3  . carvedilol (COREG) 6.25 MG tablet    Sig: TAKE 1 & 1/2 (ONE & ONE-HALF) TABLETS BY MOUTH TWICE DAILY WITH MEALS    Dispense:  270 tablet    Refill:  3   No orders of the defined types were placed in this encounter.   Referral Orders  No referral(s) requested today   [name], MSN, ANE, FNP-BC [name] [place] [address] [place], [place] [address] [phone] fax   Problem List Items Addressed This Visit      Cardiovascular and Mediastinum   Essential hypertension, malignant - Primary   Relevant Medications   carvedilol (COREG) 6.25 MG tablet     Endocrine   Hypothyroidism   Relevant Medications   thyroid (NP THYROID) 90 MG tablet   carvedilol (COREG) 6.25 MG tablet    Other Visit Diagnoses    Atypical chest pain       Vitamin D deficiency       Healthcare maintenance       Follow up          Meds ordered this encounter  Medications  . thyroid (NP THYROID) 90 MG tablet    Sig: Take 1 tablet (90 mg total) by mouth daily.    Dispense:  90 tablet    Refill:  3  . carvedilol (COREG) 6.25 MG tablet    Sig: TAKE 1 & 1/2 (ONE & ONE-HALF) TABLETS BY MOUTH TWICE DAILY WITH MEALS    Dispense:  270 tablet    Refill:  3    Follow-up: No follow-ups on file.    Azzie Glatter, FNP

## 2020-08-18 ENCOUNTER — Encounter: Payer: Self-pay | Admitting: Family Medicine

## 2020-10-30 ENCOUNTER — Ambulatory Visit: Payer: Medicare HMO | Admitting: Family Medicine

## 2020-11-23 ENCOUNTER — Telehealth: Payer: Self-pay | Admitting: Nurse Practitioner

## 2020-11-23 ENCOUNTER — Other Ambulatory Visit (HOSPITAL_COMMUNITY): Payer: Self-pay

## 2020-11-23 MED FILL — Carvedilol Tab 6.25 MG: ORAL | 90 days supply | Qty: 270 | Fill #0 | Status: AC

## 2020-11-23 MED FILL — Thyroid Tab 90 MG (1 1/2 Grain): ORAL | 30 days supply | Qty: 30 | Fill #0 | Status: CN

## 2020-11-23 NOTE — Telephone Encounter (Signed)
Pt was called VM was left to schedule her AWV Appointment

## 2020-11-24 ENCOUNTER — Other Ambulatory Visit (HOSPITAL_COMMUNITY): Payer: Self-pay

## 2020-11-29 ENCOUNTER — Other Ambulatory Visit: Payer: Self-pay | Admitting: Family Medicine

## 2020-11-29 DIAGNOSIS — Z1231 Encounter for screening mammogram for malignant neoplasm of breast: Secondary | ICD-10-CM

## 2020-12-13 ENCOUNTER — Other Ambulatory Visit: Payer: Self-pay

## 2020-12-13 ENCOUNTER — Ambulatory Visit (INDEPENDENT_AMBULATORY_CARE_PROVIDER_SITE_OTHER): Payer: Medicare HMO | Admitting: Nurse Practitioner

## 2020-12-13 VITALS — BP 147/88 | HR 67 | Temp 97.2°F | Ht 66.5 in | Wt 246.1 lb

## 2020-12-13 DIAGNOSIS — M810 Age-related osteoporosis without current pathological fracture: Secondary | ICD-10-CM | POA: Diagnosis not present

## 2020-12-13 DIAGNOSIS — E039 Hypothyroidism, unspecified: Secondary | ICD-10-CM

## 2020-12-13 DIAGNOSIS — K219 Gastro-esophageal reflux disease without esophagitis: Secondary | ICD-10-CM

## 2020-12-13 DIAGNOSIS — E559 Vitamin D deficiency, unspecified: Secondary | ICD-10-CM | POA: Diagnosis not present

## 2020-12-13 DIAGNOSIS — I10 Essential (primary) hypertension: Secondary | ICD-10-CM | POA: Diagnosis not present

## 2020-12-13 DIAGNOSIS — E876 Hypokalemia: Secondary | ICD-10-CM

## 2020-12-13 DIAGNOSIS — I5022 Chronic systolic (congestive) heart failure: Secondary | ICD-10-CM | POA: Diagnosis not present

## 2020-12-13 DIAGNOSIS — Z13220 Encounter for screening for lipoid disorders: Secondary | ICD-10-CM

## 2020-12-13 LAB — POCT URINALYSIS DIPSTICK
Bilirubin, UA: NEGATIVE
Blood, UA: NEGATIVE
Glucose, UA: NEGATIVE
Ketones, UA: NEGATIVE
Leukocytes, UA: NEGATIVE
Nitrite, UA: NEGATIVE
Protein, UA: POSITIVE — AB
Spec Grav, UA: 1.02 (ref 1.010–1.025)
Urobilinogen, UA: 1 E.U./dL
pH, UA: 7.5 (ref 5.0–8.0)

## 2020-12-13 MED ORDER — SPIRONOLACTONE 25 MG PO TABS: 1.0000 | ORAL_TABLET | Freq: Every day | ORAL | 3 refills | Status: AC

## 2020-12-13 MED ORDER — IPRATROPIUM BROMIDE 0.03 % NA SOLN: 2.0000 | Freq: Two times a day (BID) | NASAL | 0 refills | Status: AC

## 2020-12-13 MED ORDER — LOSARTAN POTASSIUM 50 MG PO TABS: 1.0000 | ORAL_TABLET | Freq: Every day | ORAL | 3 refills | Status: AC

## 2020-12-13 MED ORDER — FUROSEMIDE 40 MG PO TABS: 40.0000 mg | ORAL_TABLET | Freq: Every day | ORAL | 3 refills | Status: DC

## 2020-12-13 MED ORDER — CARVEDILOL 6.25 MG PO TABS: ORAL_TABLET | ORAL | 3 refills | Status: AC

## 2020-12-13 MED ORDER — OMEPRAZOLE 20 MG PO CPDR: 20.0000 mg | DELAYED_RELEASE_CAPSULE | Freq: Two times a day (BID) | ORAL | 3 refills | Status: AC

## 2020-12-13 NOTE — Progress Notes (Signed)
[place], [place]  [address] Phone:  [phone]   Fax:  [phone]   Established Patient Office Visit  Subjective:  Patient ID: Julia [name], female    DOB: October 11, 1955  Age: 65 y.o. MRN: [id number]  CC:  Chief Complaint  Patient presents with   Annual Exam    No questions or concerns.     HPI Julia [name] presents for annual exam. A former patient of NP Clovis Riley. She  has a past medical history of Allergy, Anxiety, CHF (congestive heart failure) ([place]), GERD (gastroesophageal reflux disease), Hypertension, Hypothyroidism (11/2018), Mitral regurgitation, Systolic CHF ([place]), Tobacco abuse, and Vitamin D deficiency.   She is in today for an annual exam. She has hypertension along with mitral regurgitation, heart failure and LBBB. She has not been seen by cardiology in greater than 6 months. She denies any chest pain. She reports occasional shortness of breath.   Hypothyroidism She also presents for follow up of hypothyroidism. Current symptoms: weight gain noted. Patient denies change in energy level, diarrhea, heat / cold intolerance, nervousness, and palpitations. Symptoms have stabilized. She reports having had to change her medication on several occasions and the current thyroid ([place]) is the most effective and well tolerated  Past Medical History:  Diagnosis Date   Allergy    Anxiety    CHF (congestive heart failure) ([facility])    GERD (gastroesophageal reflux disease)    Hypertension    Hypothyroidism 11/2018   Mitral regurgitation    a. severe by ECHO  06/7406   Systolic CHF ([place])    a. 2D ECHO: 02/09/2014; EF 35-30%, mild LV dilation, severe, diffuse hypokinesis, regional WMAs cannot be excluded. Ventricular septum with mild dyssynergy c/w LBBB, mild AR, severe MR, severely dilated LA.   Tobacco abuse    Vitamin D deficiency     Past Surgical History:  Procedure Laterality Date   ABDOMINAL HYSTERECTOMY     partial    CARDIAC CATHETERIZATION  01/2014   Normal coronaries. EF of 35 to 40%   DIAGNOSTIC LAPAROSCOPY     to remove fibrioid tumor   LEFT HEART CATHETERIZATION WITH CORONARY ANGIOGRAM N/A 02/10/2014   Procedure: LEFT HEART CATHETERIZATION WITH CORONARY ANGIOGRAM;  Surgeon: Sinclair Grooms, MD;  Location: [facility] CATH LAB;  Service: Cardiovascular;  Laterality: N/A;   MOUTH SURGERY N/A 2017   TUBAL LIGATION      Family History  Problem Relation Age of Onset   Diabetes Mother    Hypertension Mother    Diabetes Father    Heart disease Father    Diabetes Sister    Drug abuse Sister    Hypertension Sister    Mental illness Sister    Stroke Sister    Miscarriages / [place] Sister    Alcohol abuse Brother    Cancer Brother    Alcohol abuse Maternal Aunt    Alcohol abuse Maternal Uncle    Alcohol abuse Paternal Uncle    Cancer Paternal Uncle    Colon cancer Neg Hx    Colon polyps Neg Hx     Social History   Socioeconomic History   Marital status: Single    Spouse name: Not on file   Number of children: Not on file   Years of education: Not on file   Highest education level: Not on file  Occupational History   Not on file  Tobacco Use   Smoking status: Former    Packs/day: 0.50  Pack years: 0.00    Types: Cigarettes    Quit date: 02/15/2014    Years since quitting: 6.8   Smokeless tobacco: Never  Vaping Use   Vaping Use: Never used  Substance and Sexual Activity   Alcohol use: No    Alcohol/week: 0.0 standard drinks   Drug use: No   Sexual activity: Yes  Other Topics Concern   Not on file  Social History Narrative   Not on file   Social Determinants of Health   Financial Resource Strain: Not on file  Food Insecurity: Not on file  Transportation Needs: Not on file  Physical Activity: Not on file  Stress: Not on file  Social Connections: Not on file  Intimate Partner Violence: Not on file    Outpatient Medications Prior to Visit  Medication Sig Dispense Refill    thyroid (ARMOUR) 90 MG tablet TAKE 1 TABLET (90 MG TOTAL) BY MOUTH DAILY. 90 tablet 3   Vitamin D, Ergocalciferol, (DRISDOL) 1.25 MG (50000 UNIT) CAPS capsule Take 1 capsule by mouth once a week 35 capsule 0   carvedilol (COREG) 6.25 MG tablet TAKE 1 & 1/2 (ONE & ONE-HALF) TABLETS BY MOUTH TWICE DAILY WITH MEALS 270 tablet 3   furosemide (LASIX) 40 MG tablet Take 1/2 (one-half) tablet by mouth once daily 45 tablet 11   ipratropium (ATROVENT) 0.03 % nasal spray Place 2 sprays into both nostrils 2 (two) times daily. 30 mL 0   losartan (COZAAR) 50 MG tablet Take 1 tablet by mouth once daily 90 tablet 2   omeprazole (PRILOSEC) 20 MG capsule Take 1 capsule (20 mg total) by mouth 2 (two) times daily before a meal. As needed. 60 capsule 11   spironolactone (ALDACTONE) 25 MG tablet Take 1 tablet by mouth once daily 90 tablet 3   Facility-Administered Medications Prior to Visit  Medication Dose Route Frequency Provider Last Rate Last Admin   0.9 %  sodium chloride infusion  500 mL Intravenous Continuous Nandigam, Venia Minks, MD        Allergies  Allergen Reactions   Synthroid [Levothyroxine Sodium]     Lip swelling and itching.     ROS Review of Systems  Constitutional: Negative.   HENT:         Zrytec effective   Eyes:  Negative for visual disturbance (wear glasses).  Respiratory: Negative.  Negative for shortness of breath (with exertion due to deconditioning).   Gastrointestinal:  Negative for constipation, diarrhea and nausea.  Endocrine: Negative.   Genitourinary: Negative.   Musculoskeletal: Negative.   Skin: Negative.   Allergic/Immunologic: Negative.   Neurological: Negative.   Hematological: Negative.      Objective:    Physical Exam Constitutional:      General: She is not in acute distress.    Appearance: She is obese. She is not ill-appearing, toxic-appearing or diaphoretic.  HENT:     Head: Normocephalic and atraumatic.  Cardiovascular:     Rate and Rhythm: Normal  rate and regular rhythm.     Pulses: Normal pulses.     Heart sounds: Normal heart sounds.  Pulmonary:     Effort: Pulmonary effort is normal.     Breath sounds: Normal breath sounds.  Abdominal:     General: Bowel sounds are normal.     Palpations: Abdomen is soft.  Musculoskeletal:        General: Normal range of motion.     Cervical back: Normal range of motion.     Right  lower leg: No edema.     Left lower leg: No edema.  Skin:    General: Skin is warm and dry.     Capillary Refill: Capillary refill takes less than 2 seconds.  Neurological:     General: No focal deficit present.     Mental Status: She is alert and oriented to person, place, and time.  Psychiatric:        Mood and Affect: Mood normal.        Behavior: Behavior normal.        Thought Content: Thought content normal.        Judgment: Judgment normal.   BP (!) 147/88 (BP Location: Right Arm, Patient Position: Sitting)   Pulse 67   Temp (!) 97.2 F (36.2 C)   Ht 5' 6.5" (1.689 m)   Wt 246 lb 0.8 oz (111.6 kg)   SpO2 98%   BMI 39.12 kg/m  Wt Readings from Last 3 Encounters:  12/13/20 246 lb 0.8 oz (111.6 kg)  08/13/20 237 lb 8 oz (107.7 kg)  01/03/20 239 lb (108.4 kg)     Health Maintenance Due  Topic Date Due   PAP SMEAR-Modifier  Never done    There are no preventive care reminders to display for this patient.  Lab Results  Component Value Date   TSH 0.009 (L) 11/29/2018   Lab Results  Component Value Date   WBC 5.7 06/29/2019   HGB 13.9 06/29/2019   HCT 43.9 06/29/2019   MCV 96.7 06/29/2019   PLT 218 06/29/2019   Lab Results  Component Value Date   NA 140 06/29/2019   K 4.5 06/29/2019   CO2 26 06/29/2019   GLUCOSE 116 (H) 06/29/2019   BUN 11 06/29/2019   CREATININE 0.72 06/29/2019   BILITOT 0.5 01/03/2020   ALKPHOS 75 01/03/2020   AST 14 01/03/2020   ALT 13 01/03/2020   PROT 6.7 01/03/2020   ALBUMIN 4.1 01/03/2020   CALCIUM 9.2 06/29/2019   ANIONGAP 7 06/29/2019   GFR  97.38 03/28/2014   Lab Results  Component Value Date   CHOL 171 01/03/2020   Lab Results  Component Value Date   HDL 43 01/03/2020   Lab Results  Component Value Date   LDLCALC 111 (H) 01/03/2020   Lab Results  Component Value Date   TRIG 90 01/03/2020   Lab Results  Component Value Date   CHOLHDL 4.0 01/03/2020   Lab Results  Component Value Date   HGBA1C 5.7 (A) 11/03/2018      Assessment & Plan:   Problem List Items Addressed This Visit       Cardiovascular and Mediastinum   Hypertension - Primary Persistent however stable Encouraged on going compliance with current medication regimen Encouraged home monitoring and recording BP <130/80 Eating a heart-healthy diet with less salt Encouraged regular physical activity  Recommend Weight loss   Relevant Medications   furosemide (LASIX) 40 MG tablet   carvedilol (COREG) 6.25 MG tablet   losartan (COZAAR) 50 MG tablet   spironolactone (ALDACTONE) 25 MG tablet   Other Relevant Orders   Urinalysis Dipstick (Completed)   Comp. Metabolic Panel (12) (Completed)     Digestive   Gastroesophageal reflux disease without esophagitis Stable    Relevant Medications   omeprazole (PRILOSEC) 20 MG capsule     Endocrine   Hypothyroidism Stable on current dose  Labs pending   Relevant Medications   carvedilol (COREG) 6.25 MG tablet   Other Relevant Orders  TSH (Completed)   Other Visit Diagnoses     Chronic systolic heart failure ([place])     Stable encourage cardiology follow up annually   Relevant Medications   furosemide (LASIX) 40 MG tablet   carvedilol (COREG) 6.25 MG tablet   losartan (COZAAR) 50 MG tablet   spironolactone (ALDACTONE) 25 MG tablet   Hypokalemia     Controlled continued furosemide use   Relevant Medications   losartan (COZAAR) 50 MG tablet   Screening for cholesterol level       Relevant Orders   Lipid panel (Completed)   Vitamin D deficiency       Relevant Orders   VITAMIN D 25 Hydroxy  (Vit-D Deficiency, Fractures) (Completed)   High risk for fracture due to osteoporosis by DEXA scan       Relevant Orders   HM DEXA SCAN (Completed)       Meds ordered this encounter  Medications   furosemide (LASIX) 40 MG tablet    Sig: Take 1 tablet (40 mg total) by mouth daily. Take 1/2 (one-half) tablet by mouth once daily    Dispense:  90 tablet    Refill:  3    Order Specific Question:   Supervising Provider    Answer:   Tresa Garter [[id number]]   carvedilol (COREG) 6.25 MG tablet    Sig: TAKE 1 & 1/2 (ONE & ONE-HALF) TABLETS BY MOUTH TWICE DAILY WITH MEALS    Dispense:  270 tablet    Refill:  3    Order Specific Question:   Supervising Provider    Answer:   Tresa Garter [[id number]]   ipratropium (ATROVENT) 0.03 % nasal spray    Sig: Place 2 sprays into both nostrils 2 (two) times daily.    Dispense:  30 mL    Refill:  0    Order Specific Question:   Supervising Provider    Answer:   Tresa Garter [[id number]]   losartan (COZAAR) 50 MG tablet    Sig: Take 1 tablet (50 mg total) by mouth daily.    Dispense:  90 tablet    Refill:  3    PLEASE SCHEDULE AN APPT FOR FUTURE REFILLS    Order Specific Question:   Supervising Provider    Answer:   Tresa Garter [[id number]]   omeprazole (PRILOSEC) 20 MG capsule    Sig: Take 1 capsule (20 mg total) by mouth 2 (two) times daily before a meal. As needed.    Dispense:  180 capsule    Refill:  3    Order Specific Question:   Supervising Provider    Answer:   Tresa Garter [[id number]]   spironolactone (ALDACTONE) 25 MG tablet    Sig: Take 1 tablet (25 mg total) by mouth daily.    Dispense:  90 tablet    Refill:  3    Order Specific Question:   Supervising Provider    Answer:   Tresa Garter [id number]    Follow-up: No follow-ups on file.    Vevelyn Francois, NP

## 2020-12-13 NOTE — Patient Instructions (Signed)
Health Maintenance, Female Adopting a healthy lifestyle and getting preventive care are important in promoting health and wellness. Ask your health care provider about: The right schedule for you to have regular tests and exams. Things you can do on your own to prevent diseases and keep yourself healthy. What should I know about diet, weight, and exercise? Eat a healthy diet  Eat a diet that includes plenty of vegetables, fruits, low-fat dairy products, and lean protein. Do not eat a lot of foods that are high in solid fats, added sugars, or sodium.  Maintain a healthy weight Body mass index (BMI) is used to identify weight problems. It estimates body fat based on height and weight. Your health care provider can help determineyour BMI and help you achieve or maintain a healthy weight. Get regular exercise Get regular exercise. This is one of the most important things you can do for your health. Most adults should: Exercise for at least 150 minutes each week. The exercise should increase your heart rate and make you sweat (moderate-intensity exercise). Do strengthening exercises at least twice a week. This is in addition to the moderate-intensity exercise. Spend less time sitting. Even light physical activity can be beneficial. Watch cholesterol and blood lipids Have your blood tested for lipids and cholesterol at 65 years of age, then havethis test every 5 years. Have your cholesterol levels checked more often if: Your lipid or cholesterol levels are high. You are older than 65 years of age. You are at high risk for heart disease. What should I know about cancer screening? Depending on your health history and family history, you may need to have cancer screening at various ages. This may include screening for: Breast cancer. Cervical cancer. Colorectal cancer. Skin cancer. Lung cancer. What should I know about heart disease, diabetes, and high blood pressure? Blood pressure and heart  disease High blood pressure causes heart disease and increases the risk of stroke. This is more likely to develop in people who have high blood pressure readings, are of African descent, or are overweight. Have your blood pressure checked: Every 3-5 years if you are 18-39 years of age. Every year if you are 40 years old or older. Diabetes Have regular diabetes screenings. This checks your fasting blood sugar level. Have the screening done: Once every three years after age 40 if you are at a normal weight and have a low risk for diabetes. More often and at a younger age if you are overweight or have a high risk for diabetes. What should I know about preventing infection? Hepatitis B If you have a higher risk for hepatitis B, you should be screened for this virus. Talk with your health care provider to find out if you are at risk forhepatitis B infection. Hepatitis C Testing is recommended for: Everyone born from 1945 through 1965. Anyone with known risk factors for hepatitis C. Sexually transmitted infections (STIs) Get screened for STIs, including gonorrhea and chlamydia, if: You are sexually active and are younger than 65 years of age. You are older than 65 years of age and your health care provider tells you that you are at risk for this type of infection. Your sexual activity has changed since you were last screened, and you are at increased risk for chlamydia or gonorrhea. Ask your health care provider if you are at risk. Ask your health care provider about whether you are at high risk for HIV. Your health care provider may recommend a prescription medicine to help   prevent HIV infection. If you choose to take medicine to prevent HIV, you should first get tested for HIV. You should then be tested every 3 months for as long as you are taking the medicine. Pregnancy If you are about to stop having your period (premenopausal) and you may become pregnant, seek counseling before you get  pregnant. Take 400 to 800 micrograms (mcg) of folic acid every day if you become pregnant. Ask for birth control (contraception) if you want to prevent pregnancy. Osteoporosis and menopause Osteoporosis is a disease in which the bones lose minerals and strength with aging. This can result in bone fractures. If you are 65 years old or older, or if you are at risk for osteoporosis and fractures, ask your health care provider if you should: Be screened for bone loss. Take a calcium or vitamin D supplement to lower your risk of fractures. Be given hormone replacement therapy (HRT) to treat symptoms of menopause. Follow these instructions at home: Lifestyle Do not use any products that contain nicotine or tobacco, such as cigarettes, e-cigarettes, and chewing tobacco. If you need help quitting, ask your health care provider. Do not use street drugs. Do not share needles. Ask your health care provider for help if you need support or information about quitting drugs. Alcohol use Do not drink alcohol if: Your health care provider tells you not to drink. You are pregnant, may be pregnant, or are planning to become pregnant. If you drink alcohol: Limit how much you use to 0-1 drink a day. Limit intake if you are breastfeeding. Be aware of how much alcohol is in your drink. In the [place] one drink equals one 12 oz bottle of beer (355 mL), one 5 oz glass of wine (148 mL), or one 1 oz glass of hard liquor (44 mL). General instructions Schedule regular health, dental, and eye exams. Stay current with your vaccines. Tell your health care provider if: You often feel depressed. You have ever been abused or do not feel safe at home. Summary Adopting a healthy lifestyle and getting preventive care are important in promoting health and wellness. Follow your health care provider's instructions about healthy diet, exercising, and getting tested or screened for diseases. Follow your health care provider's  instructions on monitoring your cholesterol and blood pressure. This information is not intended to replace advice given to you by your health care provider. Make sure you discuss any questions you have with your healthcare provider. Document Revised: 06/09/2018 Document Reviewed: 06/09/2018 Elsevier Patient Education  2022 Elsevier Inc.  

## 2020-12-14 LAB — COMP. METABOLIC PANEL (12)
AST: 12 IU/L (ref 0–40)
Albumin/Globulin Ratio: 1.6 (ref 1.2–2.2)
Albumin: 4.3 g/dL (ref 3.8–4.8)
Alkaline Phosphatase: 79 IU/L (ref 44–121)
BUN/Creatinine Ratio: 13 (ref 12–28)
BUN: 9 mg/dL (ref 8–27)
Bilirubin Total: 0.4 mg/dL (ref 0.0–1.2)
Calcium: 9.4 mg/dL (ref 8.7–10.3)
Chloride: 102 mmol/L (ref 96–106)
Creatinine, Ser: 0.72 mg/dL (ref 0.57–1.00)
Globulin, Total: 2.7 g/dL (ref 1.5–4.5)
Glucose: 123 mg/dL — ABNORMAL HIGH (ref 65–99)
Potassium: 4.5 mmol/L (ref 3.5–5.2)
Sodium: 141 mmol/L (ref 134–144)
Total Protein: 7 g/dL (ref 6.0–8.5)
eGFR: 93 mL/min/{1.73_m2} (ref >59)

## 2020-12-14 LAB — TSH: TSH: 1.11 u[IU]/mL (ref 0.450–4.500)

## 2020-12-14 LAB — VITAMIN D 25 HYDROXY (VIT D DEFICIENCY, FRACTURES): Vit D, 25-Hydroxy: 27 ng/mL — ABNORMAL LOW (ref 30.0–100.0)

## 2020-12-14 LAB — LIPID PANEL
Chol/HDL Ratio: 4.5 ratio — ABNORMAL HIGH (ref 0.0–4.4)
Cholesterol, Total: 202 mg/dL — ABNORMAL HIGH (ref 100–199)
HDL: 45 mg/dL (ref >39)
LDL Chol Calc (NIH): 130 mg/dL — ABNORMAL HIGH (ref 0–99)
Triglycerides: 149 mg/dL (ref 0–149)
VLDL Cholesterol Cal: 27 mg/dL (ref 5–40)

## 2020-12-17 ENCOUNTER — Telehealth: Payer: Self-pay

## 2020-12-17 NOTE — Telephone Encounter (Signed)
Please call pt AGAIN about blood work

## 2020-12-17 NOTE — Telephone Encounter (Signed)
Returned patient phone call, notified of results.

## 2020-12-28 ENCOUNTER — Other Ambulatory Visit: Payer: Self-pay | Admitting: Nurse Practitioner

## 2020-12-28 DIAGNOSIS — E039 Hypothyroidism, unspecified: Secondary | ICD-10-CM

## 2020-12-28 DIAGNOSIS — E559 Vitamin D deficiency, unspecified: Secondary | ICD-10-CM

## 2020-12-28 MED ORDER — THYROID 90 MG PO TABS: ORAL_TABLET | ORAL | 3 refills | Status: AC

## 2020-12-28 MED ORDER — VITAMIN D (ERGOCALCIFEROL) 1.25 MG (50000 UNIT) PO CAPS: 50000.0000 [IU] | ORAL_CAPSULE | ORAL | 0 refills | Status: AC

## 2021-01-01 ENCOUNTER — Telehealth: Payer: Self-pay | Admitting: Nurse Practitioner

## 2021-01-01 NOTE — Telephone Encounter (Signed)
Via Covermymeds: (Key: [id number]) - [id number] NP Thyroid 90MG  tablets     Status: PA Response - Approved

## 2021-01-02 ENCOUNTER — Other Ambulatory Visit: Payer: Self-pay | Admitting: Nurse Practitioner

## 2021-01-02 DIAGNOSIS — I5022 Chronic systolic (congestive) heart failure: Secondary | ICD-10-CM

## 2021-01-02 MED ORDER — FUROSEMIDE 40 MG PO TABS: 20.0000 mg | ORAL_TABLET | Freq: Every day | ORAL | 3 refills | Status: AC

## 2021-02-15 ENCOUNTER — Ambulatory Visit: Payer: Medicare HMO

## 2021-02-16 ENCOUNTER — Ambulatory Visit
Admission: RE | Admit: 2021-02-16 | Discharge: 2021-02-16 | Disposition: A | Discharge status: Home / Self Care | Payer: Medicare Other | Source: Ambulatory Visit | Attending: Family Medicine | Admitting: Family Medicine

## 2021-02-16 ENCOUNTER — Other Ambulatory Visit: Payer: Self-pay

## 2021-02-16 DIAGNOSIS — Z1231 Encounter for screening mammogram for malignant neoplasm of breast: Secondary | ICD-10-CM

## 2021-02-27 ENCOUNTER — Other Ambulatory Visit (HOSPITAL_COMMUNITY): Payer: Self-pay

## 2021-03-12 ENCOUNTER — Ambulatory Visit: Payer: Medicare Other

## 2021-03-19 ENCOUNTER — Ambulatory Visit (INDEPENDENT_AMBULATORY_CARE_PROVIDER_SITE_OTHER): Payer: Medicare Other

## 2021-03-19 ENCOUNTER — Other Ambulatory Visit: Payer: Self-pay

## 2021-03-19 DIAGNOSIS — Z0000 Encounter for general adult medical examination without abnormal findings: Secondary | ICD-10-CM | POA: Diagnosis not present

## 2021-03-19 NOTE — Patient Instructions (Signed)
Health Maintenance, Female Adopting a healthy lifestyle and getting preventive care are important in promoting health and wellness. Ask your health care provider about: The right schedule for you to have regular tests and exams. Things you can do on your own to prevent diseases and keep yourself healthy. What should I know about diet, weight, and exercise? Eat a healthy diet  Eat a diet that includes plenty of vegetables, fruits, low-fat dairy products, and lean protein. Do not eat a lot of foods that are high in solid fats, added sugars, or sodium. Maintain a healthy weight Body mass index (BMI) is used to identify weight problems. It estimates body fat based on height and weight. Your health care provider can help determine your BMI and help you achieve or maintain a healthy weight. Get regular exercise Get regular exercise. This is one of the most important things you can do for your health. Most adults should: Exercise for at least 150 minutes each week. The exercise should increase your heart rate and make you sweat (moderate-intensity exercise). Do strengthening exercises at least twice a week. This is in addition to the moderate-intensity exercise. Spend less time sitting. Even light physical activity can be beneficial. Watch cholesterol and blood lipids Have your blood tested for lipids and cholesterol at 65 years of age, then have this test every 5 years. Have your cholesterol levels checked more often if: Your lipid or cholesterol levels are high. You are older than 65 years of age. You are at high risk for heart disease. What should I know about cancer screening? Depending on your health history and family history, you may need to have cancer screening at various ages. This may include screening for: Breast cancer. Cervical cancer. Colorectal cancer. Skin cancer. Lung cancer. What should I know about heart disease, diabetes, and high blood pressure? Blood pressure and heart  disease High blood pressure causes heart disease and increases the risk of stroke. This is more likely to develop in people who have high blood pressure readings, are of African descent, or are overweight. Have your blood pressure checked: Every 3-5 years if you are 18-39 years of age. Every year if you are 40 years old or older. Diabetes Have regular diabetes screenings. This checks your fasting blood sugar level. Have the screening done: Once every three years after age 40 if you are at a normal weight and have a low risk for diabetes. More often and at a younger age if you are overweight or have a high risk for diabetes. What should I know about preventing infection? Hepatitis B If you have a higher risk for hepatitis B, you should be screened for this virus. Talk with your health care provider to find out if you are at risk for hepatitis B infection. Hepatitis C Testing is recommended for: Everyone born from 1945 through 1965. Anyone with known risk factors for hepatitis C. Sexually transmitted infections (STIs) Get screened for STIs, including gonorrhea and chlamydia, if: You are sexually active and are younger than 65 years of age. You are older than 65 years of age and your health care provider tells you that you are at risk for this type of infection. Your sexual activity has changed since you were last screened, and you are at increased risk for chlamydia or gonorrhea. Ask your health care provider if you are at risk. Ask your health care provider about whether you are at high risk for HIV. Your health care provider may recommend a prescription medicine   to help prevent HIV infection. If you choose to take medicine to prevent HIV, you should first get tested for HIV. You should then be tested every 3 months for as long as you are taking the medicine. Pregnancy If you are about to stop having your period (premenopausal) and you may become pregnant, seek counseling before you get  pregnant. Take 400 to 800 micrograms (mcg) of folic acid every day if you become pregnant. Ask for birth control (contraception) if you want to prevent pregnancy. Osteoporosis and menopause Osteoporosis is a disease in which the bones lose minerals and strength with aging. This can result in bone fractures. If you are 65 years old or older, or if you are at risk for osteoporosis and fractures, ask your health care provider if you should: Be screened for bone loss. Take a calcium or vitamin D supplement to lower your risk of fractures. Be given hormone replacement therapy (HRT) to treat symptoms of menopause. Follow these instructions at home: Lifestyle Do not use any products that contain nicotine or tobacco, such as cigarettes, e-cigarettes, and chewing tobacco. If you need help quitting, ask your health care provider. Do not use street drugs. Do not share needles. Ask your health care provider for help if you need support or information about quitting drugs. Alcohol use Do not drink alcohol if: Your health care provider tells you not to drink. You are pregnant, may be pregnant, or are planning to become pregnant. If you drink alcohol: Limit how much you use to 0-1 drink a day. Limit intake if you are breastfeeding. Be aware of how much alcohol is in your drink. In the [place] one drink equals one 12 oz bottle of beer (355 mL), one 5 oz glass of wine (148 mL), or one 1 oz glass of hard liquor (44 mL). General instructions Schedule regular health, dental, and eye exams. Stay current with your vaccines. Tell your health care provider if: You often feel depressed. You have ever been abused or do not feel safe at home. Summary Adopting a healthy lifestyle and getting preventive care are important in promoting health and wellness. Follow your health care provider's instructions about healthy diet, exercising, and getting tested or screened for diseases. Follow your health care provider's  instructions on monitoring your cholesterol and blood pressure. This information is not intended to replace advice given to you by your health care provider. Make sure you discuss any questions you have with your health care provider. Document Revised: 08/24/2020 Document Reviewed: 06/09/2018 Elsevier Patient Education  2022 Elsevier Inc.  

## 2021-03-19 NOTE — Progress Notes (Addendum)
Subjective:   Julia [name] is a 65 y.o. female who presents for an Initial Medicare Annual Wellness Visit. I connected with  Julia [name] on 51/02/58 by audio enabled telemedicine application and verified that I am speaking with the correct person using two identifiers.   I discussed the limitations of evaluation and management by telemedicine. The patient expressed understanding and agreed to proceed.   Location of Patient: Home Location of Provider: Office   List any persons and their roles That are participating in the visit with the patient. Lavell Luster, LPN  [facility] Manual - Patient  Review of Systems    Defer to PCP       Objective:    There were no vitals filed for this visit. There is no height or weight on file to calculate BMI.  Advanced Directives 07/09/2016 04/24/2016 10/01/2015 08/20/2015 02/09/2014  Does Patient Have a Medical Advance Directive? No No No No No  Would patient like information on creating a medical advance directive? - No - patient declined information - No - patient declined information Yes - Educational materials given    Current Medications (verified) Outpatient Encounter Medications as of 03/19/2021  Medication Sig   carvedilol (COREG) 6.25 MG tablet TAKE 1 & 1/2 (ONE & ONE-HALF) TABLETS BY MOUTH TWICE DAILY WITH MEALS   furosemide (LASIX) 40 MG tablet Take 0.5 tablets (20 mg total) by mouth daily.   ipratropium (ATROVENT) 0.03 % nasal spray Place 2 sprays into both nostrils 2 (two) times daily.   losartan (COZAAR) 50 MG tablet Take 1 tablet (50 mg total) by mouth daily.   omeprazole (PRILOSEC) 20 MG capsule Take 1 capsule (20 mg total) by mouth 2 (two) times daily before a meal. As needed.   spironolactone (ALDACTONE) 25 MG tablet Take 1 tablet (25 mg total) by mouth daily.   thyroid (ARMOUR) 90 MG tablet TAKE 1 TABLET (90 MG TOTAL) BY MOUTH DAILY.   Vitamin D, Ergocalciferol, (DRISDOL) 1.25 MG (50000 UNIT) CAPS capsule Take 1 capsule  (50,000 Units total) by mouth once a week.   Facility-Administered Encounter Medications as of 03/19/2021  Medication   0.9 %  sodium chloride infusion    Allergies (verified) Synthroid [levothyroxine sodium]   History: Past Medical History:  Diagnosis Date   Allergy    Anxiety    CHF (congestive heart failure) (HCC)    GERD (gastroesophageal reflux disease)    Hypertension    Hypothyroidism 11/2018   Mitral regurgitation    a. severe by ECHO  10/2776   Systolic CHF ([place])    a. 2D ECHO: 02/09/2014; EF 35-30%, mild LV dilation, severe, diffuse hypokinesis, regional WMAs cannot be excluded. Ventricular septum with mild dyssynergy c/w LBBB, mild AR, severe MR, severely dilated LA.   Tobacco abuse    Vitamin D deficiency    Past Surgical History:  Procedure Laterality Date   ABDOMINAL HYSTERECTOMY     partial   CARDIAC CATHETERIZATION  01/2014   Normal coronaries. EF of 35 to 40%   DIAGNOSTIC LAPAROSCOPY     to remove fibrioid tumor   LEFT HEART CATHETERIZATION WITH CORONARY ANGIOGRAM N/A 02/10/2014   Procedure: LEFT HEART CATHETERIZATION WITH CORONARY ANGIOGRAM;  Surgeon: Sinclair Grooms, MD;  Location[facility] CATH LAB;  Service: Cardiovascular;  Laterality: N/A;   MOUTH SURGERY N/A 2017   TUBAL LIGATION     Family History  Problem Relation Age of Onset   Diabetes Mother    Hypertension Mother  Diabetes Father    Heart disease Father    Diabetes Sister    Drug abuse Sister    Hypertension Sister    Mental illness Sister    Stroke Sister    59 / [place] Sister    Alcohol abuse Brother    Cancer Brother    Alcohol abuse Maternal Aunt    Alcohol abuse Maternal Uncle    Alcohol abuse Paternal Uncle    Cancer Paternal Uncle    Colon cancer Neg Hx    Colon polyps Neg Hx    Social History   Socioeconomic History   Marital status: Single    Spouse name: Not on file   Number of children: Not on file   Years of education: Not on file   Highest education  level: Not on file  Occupational History   Not on file  Tobacco Use   Smoking status: Former    Packs/day: 0.50    Types: Cigarettes    Quit date: 02/15/2014    Years since quitting: 7.0   Smokeless tobacco: Never  Vaping Use   Vaping Use: Never used  Substance and Sexual Activity   Alcohol use: No    Alcohol/week: 0.0 standard drinks   Drug use: No   Sexual activity: Yes  Other Topics Concern   Not on file  Social History Narrative   Not on file   Social Determinants of Health   Financial Resource Strain: Not on file  Food Insecurity: Not on file  Transportation Needs: Not on file  Physical Activity: Not on file  Stress: Not on file  Social Connections: Not on file    Tobacco Counseling Counseling given: Not Answered   Clinical Intake:                 Diabetic?No         Activities of Daily Living No flowsheet data found.  Patient Care Team: Vevelyn Francois, NP as PCP - General (Adult Health Nurse Practitioner) Lorretta Harp, MD as PCP - Cardiology (Cardiology)  Indicate any recent Medical Services you may have received from other than [facility] providers in the past year (date may be approximate).     Assessment:   This is a routine wellness examination for Julia [name].  Hearing/Vision screen No results found.  Dietary issues and exercise activities discussed:     Goals Addressed   None   Depression Screen PHQ 2/9 Scores 08/13/2020 05/24/2019 02/07/2019 02/07/2019 11/03/2018 10/06/2017 03/06/2017  PHQ - 2 Score 0 0 0 0 0 0 0    Fall Risk Fall Risk  12/13/2020 08/13/2020 12/14/2019 05/24/2019 02/07/2019  Falls in the past year? 0 0 0 0 0  Number falls in past yr: 0 - 0 0 0  Injury with Fall? 0 - 0 0 0  Follow up - Falls evaluation completed - - -    FALL RISK PREVENTION PERTAINING TO THE HOME:  Any stairs in or around the home? Yes  If so, are there any without handrails? No  Home free of loose throw rugs in walkways, pet beds, electrical  cords, etc? Yes  Adequate lighting in your home to reduce risk of falls? Yes   ASSISTIVE DEVICES UTILIZED TO PREVENT FALLS:  Life alert? No  Use of a cane, walker or w/c? No  Grab bars in the bathroom? No  Shower chair or bench in shower? No  Elevated toilet seat or a handicapped toilet? No   TIMED UP AND GO:  Was the test performed?  N/A .  Length of time to ambulate 10 feet: N/A sec.     Cognitive Function:        Immunizations Immunization History  Administered Date(s) Administered   Td 07/01/1999   Tdap 04/24/2016    TDAP status: Up to date  Flu Vaccine status: Declined, Education has been provided regarding the importance of this vaccine but patient still declined. Advised may receive this vaccine at local pharmacy or Health Dept. Aware to provide a copy of the vaccination record if obtained from local pharmacy or Health Dept. Verbalized acceptance and understanding.  Pneumococcal vaccine status: Declined,  Education has been provided regarding the importance of this vaccine but patient still declined. Advised may receive this vaccine at local pharmacy or Health Dept. Aware to provide a copy of the vaccination record if obtained from local pharmacy or Health Dept. Verbalized acceptance and understanding.   Covid-19 vaccine status: Declined, Education has been provided regarding the importance of this vaccine but patient still declined. Advised may receive this vaccine at local pharmacy or Health Dept.or vaccine clinic. Aware to provide a copy of the vaccination record if obtained from local pharmacy or Health Dept. Verbalized acceptance and understanding.  Qualifies for Shingles Vaccine? Yes   Zostavax completed No   Shingrix Completed?: No.    Education has been provided regarding the importance of this vaccine. Patient has been advised to call insurance company to determine out of pocket expense if they have not yet received this vaccine. Advised may also receive  vaccine at local pharmacy or Health Dept. Verbalized acceptance and understanding.  Screening Tests Health Maintenance  Topic Date Due   COVID-19 Vaccine (1) Never done   PAP SMEAR-Modifier  Never done   Zoster Vaccines- Shingrix (1 of 2) Never done   INFLUENZA VACCINE  Never done   MAMMOGRAM  02/17/2023   TETANUS/TDAP  04/24/2026   COLONOSCOPY (Pts 45-13yrs Insurance coverage will need to be confirmed)  07/22/2026   DEXA SCAN  Completed   Hepatitis C Screening  Completed   HIV Screening  Completed   HPV VACCINES  Aged Out    Health Maintenance  Health Maintenance Due  Topic Date Due   COVID-19 Vaccine (1) Never done   PAP SMEAR-Modifier  Never done   Zoster Vaccines- Shingrix (1 of 2) Never done   INFLUENZA VACCINE  Never done    Colorectal cancer screening: Type of screening: Colonoscopy. Completed 07/22/2016. Repeat every 10 years  Mammogram status: Completed 02/16/2021. Repeat every year  Bone Density status: Ordered 12/13/2020. Pt provided with contact info and advised to call to schedule appt.  Lung Cancer Screening: (Low Dose CT Chest recommended if Age 95-80 years, 30 pack-year currently smoking OR have quit w/in 15years.) does not qualify.   Lung Cancer Screening Referral: N/A  Additional Screening:  Hepatitis C Screening: does qualify; Completed 02/02/2015  Vision Screening: Recommended annual ophthalmology exams for early detection of glaucoma and other disorders of the eye. Is the patient up to date with their annual eye exam?  Yes  Who is the provider or what is the name of the office in which the patient attends annual eye exams? [facility] If pt is not established with a provider, would they like to be referred to a provider to establish care?  Already Established .   Dental Screening: Recommended annual dental exams for proper oral hygiene  Community Resource Referral / Chronic Care Management: CRR required this visit?  No   CCM  required this visit?  No      Plan:     I have personally reviewed and noted the following in the patient's chart:   Medical and social history Use of alcohol, tobacco or illicit drugs  Current medications and supplements including opioid prescriptions. Patient is not currently taking opioid prescriptions. Functional ability and status Nutritional status Physical activity Advanced directives List of other physicians Hospitalizations, surgeries, and ER visits in previous 12 months Vitals Screenings to include cognitive, depression, and falls Referrals and appointments  In addition, I have reviewed and discussed with patient certain preventive protocols, quality metrics, and best practice recommendations. A written personalized care plan for preventive services as well as general preventive health recommendations were provided to patient.     Lavell Luster, LPN   3/38/2505   Nurse Notes: No Face to face 45  min visit.   Ms. Whiters , Thank you for taking time to come for your Medicare Wellness Visit. I appreciate your ongoing commitment to your health goals. Please review the following plan we discussed and let me know if I can assist you in the future.   These are the goals we discussed:  Goals   None     This is a list of the screening recommended for you and due dates:  Health Maintenance  Topic Date Due   COVID-19 Vaccine (1) Never done   Pap Smear  Never done   Zoster (Shingles) Vaccine (1 of 2) Never done   Flu Shot  Never done   Mammogram  02/17/2023   Tetanus Vaccine  04/24/2026   Colon Cancer Screening  07/22/2026   DEXA scan (bone density measurement)  Completed   Hepatitis C Screening: USPSTF Recommendation to screen - Ages 70-79 yo.  Completed   HIV Screening  Completed   HPV Vaccine  Aged Out

## 2021-04-23 ENCOUNTER — Ambulatory Visit (INDEPENDENT_AMBULATORY_CARE_PROVIDER_SITE_OTHER): Payer: Medicare HMO | Admitting: Cardiovascular Disease

## 2021-04-23 ENCOUNTER — Encounter: Payer: Self-pay | Admitting: Cardiovascular Disease

## 2021-04-23 ENCOUNTER — Other Ambulatory Visit: Payer: Self-pay

## 2021-04-23 VITALS — BP 130/82 | HR 70 | Wt 248.6 lb

## 2021-04-23 DIAGNOSIS — I10 Essential (primary) hypertension: Secondary | ICD-10-CM

## 2021-04-23 DIAGNOSIS — I447 Left bundle-branch block, unspecified: Secondary | ICD-10-CM

## 2021-04-23 DIAGNOSIS — Z8679 Personal history of other diseases of the circulatory system: Secondary | ICD-10-CM | POA: Diagnosis not present

## 2021-04-23 DIAGNOSIS — I340 Nonrheumatic mitral (valve) insufficiency: Secondary | ICD-10-CM | POA: Diagnosis not present

## 2021-04-23 DIAGNOSIS — E7800 Pure hypercholesterolemia, unspecified: Secondary | ICD-10-CM | POA: Diagnosis not present

## 2021-04-23 NOTE — Progress Notes (Signed)
[id number] Julia [name]   [birth year]  [id number]  Primary Physician Vevelyn Francois, NP Primary Cardiologist: Lorretta Harp MD Garret Reddish, [place], [place]  HPI:  Julia [name] is a 65 y.o.  mild to moderately overweight single African-American female mother of 2 children, grandmother of 58 grandchildren and has worked as a Glass blower/designer in the past and currently is on disability because of her cardiomyopathy.  I last saw her in the office 01/03/2020.Marland Kitchen  Her primary care provider is Dr. Liston Alba. She has seen Dr. Debara Pickett in the past. Her cardiovascular risk factors include 10-pack-years of tobacco having quit 3 years ago and treated hypertension. She's never had a heart attack or stroke. She denies chest pain but does get some dyspnea on exertion. She had a cardiac catheterization remotely that has shown normal coronary arteries and LV dysfunction and EF of 35% at that time which ultimately improved to 50% on medications. Since she was seen by Dr. Debara Pickett 06/18/15 she does complain of some dyspnea on exertion but denies chest pain. She is compliant with her medications and her diet.   She gets occasional shortness of breath.  Her most recent 2D echo performed 12/10/2017 was entirely normal.   Since I saw her a year ago she continues to do well.  She does walk 2 miles a day without limitation or symptoms.  She denies chest pain or shortness of breath.  She is concerned about gradual weight gain however.   Current Meds  Medication Sig   carvedilol (COREG) 6.25 MG tablet TAKE 1 & 1/2 (ONE & ONE-HALF) TABLETS BY MOUTH TWICE DAILY WITH MEALS   furosemide (LASIX) 40 MG tablet Take 0.5 tablets (20 mg total) by mouth daily.   losartan (COZAAR) 50 MG tablet Take 1 tablet (50 mg total) by mouth daily.   omeprazole (PRILOSEC) 20 MG capsule Take 1 capsule (20 mg total) by mouth 2 (two) times daily before a meal. As needed.   spironolactone (ALDACTONE) 25 MG tablet Take 1 tablet (25 mg total) by  mouth daily.   thyroid (ARMOUR) 90 MG tablet TAKE 1 TABLET (90 MG TOTAL) BY MOUTH DAILY.   Vitamin D, Ergocalciferol, (DRISDOL) 1.25 MG (50000 UNIT) CAPS capsule Take 1 capsule (50,000 Units total) by mouth once a week.   Current Facility-Administered Medications for the 04/23/21 encounter (Office Visit) with Lorretta Harp, MD  Medication   0.9 %  sodium chloride infusion     Allergies  Allergen Reactions   Synthroid [Levothyroxine Sodium]     Lip swelling and itching.     Social History   Socioeconomic History   Marital status: Single    Spouse name: Not on file   Number of children: Not on file   Years of education: Not on file   Highest education level: Not on file  Occupational History   Not on file  Tobacco Use   Smoking status: Former    Packs/day: 0.50    Types: Cigarettes    Quit date: 02/15/2014    Years since quitting: 7.1   Smokeless tobacco: Never  Vaping Use   Vaping Use: Never used  Substance and Sexual Activity   Alcohol use: No    Alcohol/week: 0.0 standard drinks   Drug use: No   Sexual activity: Yes  Other Topics Concern   Not on file  Social History Narrative   Not on file   Social Determinants of Health   Financial Resource Strain: Low Risk  Difficulty of Paying Living Expenses: Not very hard  Food Insecurity: No Food Insecurity   Worried About Running Out of Food in the Last Year: Never true   Ran Out of Food in the Last Year: Never true  Transportation Needs: No Transportation Needs   Lack of Transportation (Medical): No   Lack of Transportation (Non-Medical): No  Physical Activity: Sufficiently Active   Days of Exercise per Week: 7 days   Minutes of Exercise per Session: 30 min  Stress: No Stress Concern Present   Feeling of Stress : Not at all  Social Connections: Moderately Integrated   Frequency of Communication with Friends and Family: More than three times a week   Frequency of Social Gatherings with Friends and Family:  More than three times a week   Attends Religious Services: More than 4 times per year   Active Member of [employer] or Organizations: Yes   Attends Archivist Meetings: More than 4 times per year   Marital Status: Widowed  Human resources officer Violence: Not At Risk   Fear of Current or Ex-Partner: No   Emotionally Abused: No   Physically Abused: No   Sexually Abused: No     Review of Systems: General: negative for chills, fever, night sweats or weight changes.  Cardiovascular: negative for chest pain, dyspnea on exertion, edema, orthopnea, palpitations, paroxysmal nocturnal dyspnea or shortness of breath Dermatological: negative for rash Respiratory: negative for cough or wheezing Urologic: negative for hematuria Abdominal: negative for nausea, vomiting, diarrhea, bright red blood per rectum, melena, or hematemesis Neurologic: negative for visual changes, syncope, or dizziness All other systems reviewed and are otherwise negative except as noted above.    Blood pressure 130/82, pulse 70, weight 248 lb 9.6 oz (112.8 kg), SpO2 98 %.  General appearance: alert and no distress Neck: no adenopathy, no carotid bruit, no JVD, supple, symmetrical, trachea midline, and thyroid not enlarged, symmetric, no tenderness/mass/nodules Lungs: clear to auscultation bilaterally Heart: regular rate and rhythm, S1, S2 normal, no murmur, click, rub or gallop Extremities: extremities normal, atraumatic, no cyanosis or edema Pulses: 2+ and symmetric Skin: Skin color, texture, turgor normal. No rashes or lesions Neurologic: Grossly normal  EKG sinus rhythm at 65 with left bundle branch block which is chronic.  I personally reviewed this EKG.  ASSESSMENT AND PLAN:   LBBB (left bundle branch block) Chronic  Essential hypertension, malignant History of essential hypertension blood pressure measured today at 130/82.  She is on carvedilol, losartan and spironolactone.  Mitral regurgitation 2D  echocardiogram performed 12/10/2017 showed mild mitral regurgitation.  History of congestive cardiomyopathy History of nonischemic cardiomyopathy: EF of 35% in the past however most recently on guideline directed optimal medical therapy her echo performed 12/10/2017 revealed normal LV systolic function with grade 1 diastolic dysfunction.     Lorretta Harp MD FACP,FACC,FAHA, [facility] 04/23/2021 9:57 AM

## 2021-04-23 NOTE — Addendum Note (Signed)
Addended by: Beatrix Fetters on: 04/23/2021 10:01 AM   Modules accepted: Orders

## 2021-04-23 NOTE — Assessment & Plan Note (Signed)
History of essential hypertension blood pressure measured today at 130/82.  She is on carvedilol, losartan and spironolactone.

## 2021-04-23 NOTE — Assessment & Plan Note (Signed)
2D echocardiogram performed 12/10/2017 showed mild mitral regurgitation.

## 2021-04-23 NOTE — Assessment & Plan Note (Signed)
Chronic. 

## 2021-04-23 NOTE — Assessment & Plan Note (Signed)
History of nonischemic cardiomyopathy: EF of 35% in the past however most recently on guideline directed optimal medical therapy her echo performed 12/10/2017 revealed normal LV systolic function with grade 1 diastolic dysfunction.

## 2021-04-23 NOTE — Patient Instructions (Signed)
Medication Instructions:  Your physician recommends that you continue on your current medications as directed. Please refer to the Current Medication list given to you today.  *If you need a refill on your cardiac medications before your next appointment, please call your pharmacy*  Testing/Procedures: Dr. Berry has ordered a CT coronary calcium score. This test is done at [address]. This is $99 out of pocket.   Coronary CalciumScan A coronary calcium scan is an imaging test used to look for deposits of calcium and other fatty materials (plaques) in the inner lining of the blood vessels of the heart (coronary arteries). These deposits of calcium and plaques can partly clog and narrow the coronary arteries without producing any symptoms or warning signs. This puts a person at risk for a heart attack. This test can detect these deposits before symptoms develop. Tell a health care provider about:  Any allergies you have.  All medicines you are taking, including vitamins, herbs, eye drops, creams, and over-the-counter medicines.  Any problems you or family members have had with anesthetic medicines.  Any blood disorders you have.  Any surgeries you have had.  Any medical conditions you have.  Whether you are pregnant or may be pregnant. What are the risks? Generally, this is a safe procedure. However, problems may occur, including:  Harm to a pregnant woman and her unborn baby. This test involves the use of radiation. Radiation exposure can be dangerous to a pregnant woman and her unborn baby. If you are pregnant, you generally should not have this procedure done.  Slight increase in the risk of cancer. This is because of the radiation involved in the test. What happens before the procedure? No preparation is needed for this procedure. What happens during the procedure?  You will undress and remove any jewelry around your neck or chest.  You will put on a  hospital gown.  Sticky electrodes will be placed on your chest. The electrodes will be connected to an electrocardiogram (ECG) machine to record a tracing of the electrical activity of your heart.  A CT scanner will take pictures of your heart. During this time, you will be asked to lie still and hold your breath for 2-3 seconds while a picture of your heart is being taken. The procedure may vary among health care providers and hospitals. What happens after the procedure?  You can get dressed.  You can return to your normal activities.  It is up to you to get the results of your test. Ask your health care provider, or the department that is doing the test, when your results will be ready. Summary  A coronary calcium scan is an imaging test used to look for deposits of calcium and other fatty materials (plaques) in the inner lining of the blood vessels of the heart (coronary arteries).  Generally, this is a safe procedure. Tell your health care provider if you are pregnant or may be pregnant.  No preparation is needed for this procedure.  A CT scanner will take pictures of your heart.  You can return to your normal activities after the scan is done. This information is not intended to replace advice given to you by your health care provider. Make sure you discuss any questions you have with your health care provider. Document Released: 12/13/2007 Document Revised: 05/05/2016 Document Reviewed: 05/05/2016 Elsevier Interactive Patient Education  2017 Elsevier Inc.     Follow-Up: At [facility], you and your health needs are   our priority.  As part of our continuing mission to provide you with exceptional heart care, we have created designated Provider Care Teams.  These Care Teams include your primary Cardiologist (physician) and Advanced Practice Providers (APPs -  Physician Assistants and Nurse Practitioners) who all work together to provide you with the care you need, when you need  it.  We recommend signing up for the patient portal called "MyChart".  Sign up information is provided on this After Visit Summary.  MyChart is used to connect with patients for Virtual Visits (Telemedicine).  Patients are able to view lab/test results, encounter notes, upcoming appointments, etc.  Non-urgent messages can be sent to your provider as well.   To learn more about what you can do with MyChart, go to [email or web address].    Your next appointment:   12 month(s)  The format for your next appointment:   In Person  Provider:   Jonathan Berry, MD 

## 2021-06-05 ENCOUNTER — Ambulatory Visit (INDEPENDENT_AMBULATORY_CARE_PROVIDER_SITE_OTHER)
Admission: RE | Admit: 2021-06-05 | Discharge: 2021-06-05 | Disposition: A | Discharge status: Home / Self Care | Payer: Self-pay | Source: Ambulatory Visit | Attending: Cardiovascular Disease | Admitting: Cardiovascular Disease

## 2021-06-05 ENCOUNTER — Other Ambulatory Visit: Payer: Self-pay

## 2021-06-05 DIAGNOSIS — E7800 Pure hypercholesterolemia, unspecified: Secondary | ICD-10-CM

## 2021-07-18 ENCOUNTER — Telehealth: Payer: Self-pay | Admitting: Nurse Practitioner

## 2021-07-31 NOTE — Telephone Encounter (Signed)
Pt deceased confirmed at 59 by [facility] natural causes.

## 2021-07-31 DEATH — deceased
